# Patient Record
Sex: Female | Born: 1980 | Race: White | Hispanic: No | Smoking: Current every day smoker
Health system: Southern US, Community
[De-identification: ages and names within clinical notes are randomized; demographics above are authoritative.]

## PROBLEM LIST (undated history)

## (undated) DIAGNOSIS — F419 Anxiety disorder, unspecified: Secondary | ICD-10-CM

## (undated) DIAGNOSIS — I712 Thoracic aortic aneurysm, without rupture, unspecified: Secondary | ICD-10-CM

## (undated) DIAGNOSIS — F329 Major depressive disorder, single episode, unspecified: Secondary | ICD-10-CM

## (undated) DIAGNOSIS — F32A Depression, unspecified: Secondary | ICD-10-CM

## (undated) DIAGNOSIS — F909 Attention-deficit hyperactivity disorder, unspecified type: Secondary | ICD-10-CM

## (undated) HISTORY — DX: Depression, unspecified: F32.A

## (undated) HISTORY — PX: HIP DEBRIDEMENT: SHX1752

## (undated) HISTORY — DX: Major depressive disorder, single episode, unspecified: F32.9

---

## 2017-08-03 ENCOUNTER — Encounter: Payer: Self-pay | Admitting: Allergy and Immunology

## 2017-08-03 ENCOUNTER — Ambulatory Visit (INDEPENDENT_AMBULATORY_CARE_PROVIDER_SITE_OTHER): Payer: BLUE CROSS/BLUE SHIELD | Admitting: Allergy and Immunology

## 2017-08-03 VITALS — BP 132/100 | HR 80 | Temp 98.5°F | Resp 18 | Ht 64.0 in | Wt 160.4 lb

## 2017-08-03 DIAGNOSIS — L500 Allergic urticaria: Secondary | ICD-10-CM

## 2017-08-03 DIAGNOSIS — L989 Disorder of the skin and subcutaneous tissue, unspecified: Secondary | ICD-10-CM

## 2017-08-03 DIAGNOSIS — L308 Other specified dermatitis: Secondary | ICD-10-CM

## 2017-08-03 DIAGNOSIS — F17210 Nicotine dependence, cigarettes, uncomplicated: Secondary | ICD-10-CM | POA: Diagnosis not present

## 2017-08-03 MED ORDER — MONTELUKAST SODIUM 10 MG PO TABS: 10.0000 mg | ORAL_TABLET | Freq: Every day | ORAL | 5 refills | Status: DC

## 2017-08-03 MED ORDER — MOMETASONE FUROATE 0.1 % EX OINT: TOPICAL_OINTMENT | CUTANEOUS | 5 refills | Status: DC

## 2017-08-03 NOTE — Patient Instructions (Addendum)
  1.  Allergen avoidance measures?  2.  Every day utilize the following medications:   A.  Cetirizine 1-2 tablets 1-2 times per day (up to 40mg  daily)  B.  Montelukast 10 mg tablet 1 time per day  C.  Mometasone 0.1% ointment applied to skin lesion twice a day  3.  Blood - CBC w/diff, CMP, TSH, T4, TP,   4.  Urease breath test  5.  Continue to try nicotine substitutes to replace tobacco smoke exposure  6.  Return to clinic in 3 weeks or earlier if problem

## 2017-08-03 NOTE — Progress Notes (Signed)
Dear Dr. Jeanie Seweredding,  Thank you for referring Sheri Chapman [facility] [place] on 08/03/2017.   Below is a summation of this patient's evaluation and recommendations.  Thank you for your referral. I will keep you informed about this patient's response to treatment.   If you have any questions please do not hesitate to contact me.   Sincerely,  Jessica PriestEric J. Donabelle Molden, MD Allergy / Immunology [facility]   ______________________________________________________________________    NEW PATIENT NOTE  Referring Provider: Noni Saupeedding, John F. II, MD Primary Provider: Lise AuerKhan, Jaber A, MD Date of office visit: 08/03/2017    Subjective:   Chief Complaint:  Sheri Chapman (DOB: 02/08/1981) is a 37 y.o. female who presents to the clinic on 08/03/2017 with a chief complaint of Rash .     HPI: Sheri Chapman presents to this clinic in evaluation of a pruritic disorder.  Approximately 6 months ago Sheri Chapman started to develop red swollen areas on her skin that were approximately quarter sized and were intensely itchy with may be a very Pulido tiny fluid-filled head that she would excoriate.  She has 1 or 2 of these lesions per week.  She has multiple areas on her body that have healed with a scar or pigment because of her extensive excoriation.  She has no associated systemic or constitutional symptoms other than the fact that during these past 6 months it sounds as though she did have one episode of gastroenteritis with vomiting and diarrhea and she had some diarrhea the past 2 days.  Otherwise she does not have any associated respiratory or joint or muscle or GI symptoms with this condition in the past 6 months.  There is no obvious trigger giving rise to this issue.  She has addressed her household for bedbugs.  No one else in the household including 2 other individuals have ever had any problem with bedbugs.  She has tried to alter  her detergent.  She has treated herself with hydrocortisone cream and Benadryl which has not helped.  She has received a systemic steroid in January 2019 which has not helped.  Past Medical History:  Diagnosis Date  . Depression     Past Surgical History:  Procedure Laterality Date  . HIP DEBRIDEMENT      Allergies as of 08/03/2017   No Known Allergies     Medication List      ADDERALL XR 30 MG 24 hr capsule Generic drug:  amphetamine-dextroamphetamine Take 30 mg by mouth every morning.   ARIPiprazole 10 MG tablet Commonly known as:  ABILIFY Take 10 mg by mouth daily.   clonazePAM 1 MG tablet Commonly known as:  KLONOPIN TAKE ONE TABLET THREE TIMES A DAY, AND THEN AT BEDTIME   venlafaxine XR 75 MG 24 hr capsule Commonly known as:  EFFEXOR-XR Take 75 mg by mouth daily.       Review of systems negative except as noted in HPI / PMHx or noted below:  Review of Systems  Constitutional: Negative.   HENT: Negative.   Eyes: Negative.   Respiratory: Negative.   Cardiovascular: Negative.   Gastrointestinal: Negative.   Genitourinary: Negative.   Musculoskeletal: Negative.   Skin: Negative.   Neurological: Negative.   Endo/Heme/Allergies: Negative.   Psychiatric/Behavioral: Negative.     Family History  Problem Relation Age of Onset  . Diabetes Father   . Heart disease Maternal Grandmother   . Diabetes Maternal  Grandfather   . Diabetes Paternal Grandmother     Social History   Socioeconomic History  . Marital status: Legally Separated    Spouse name: Not on file  . Number of children: Not on file  . Years of education: Not on file  . Highest education level: Not on file  Social Needs  . Financial resource strain: Not on file  . Food insecurity - worry: Not on file  . Food insecurity - inability: Not on file  . Transportation needs - medical: Not on file  . Transportation needs - non-medical: Not on file  Occupational History  . Not on file  Tobacco  Use  . Smoking status: Current Every Day Smoker    Packs/day: 1.00    Years: 11.00    Pack years: 11.00    Types: Cigarettes  . Smokeless tobacco: Never Used  Substance and Sexual Activity  . Alcohol use: No    Frequency: Never  . Drug use: No  . Sexual activity: Not on file  Other Topics Concern  . Not on file  Social History Narrative  . Not on file    Environmental and Social history  Lives in a house with a dry environment, dogs located inside the household, Tylenol on the bedroom floor, no plastic on the bed, no plastic on the pillow, actively smoking tobacco products.  Objective:   Vitals:   08/03/17 0959  BP: (!) 132/100  Pulse: 80  Resp: 18  Temp: 98.5 F (36.9 C)   Height: 5\' 4"  (162.6 cm) Weight: 160 lb 6.4 oz (72.8 kg)  Physical Exam  Constitutional: She is well-developed, well-nourished, and in no distress.  HENT:  Head: Normocephalic. Head is without right periorbital erythema and without left periorbital erythema.  Right Ear: Tympanic membrane, external ear and ear canal normal.  Left Ear: Tympanic membrane, external ear and ear canal normal.  Nose: Nose normal. No mucosal edema or rhinorrhea.  Mouth/Throat: Uvula is midline, oropharynx is clear and moist and mucous membranes are normal. No oropharyngeal exudate.  Eyes: Conjunctivae and lids are normal. Pupils are equal, round, and reactive to light.  Neck: Trachea normal. No tracheal tenderness present. No tracheal deviation present. No thyromegaly present.  Cardiovascular: Normal rate, regular rhythm, S1 normal, S2 normal and normal heart sounds.  No murmur heard. Pulmonary/Chest: Effort normal and breath sounds normal. No stridor. No tachypnea. No respiratory distress. She has no wheezes. She has no rales. She exhibits no tenderness.  Abdominal: Soft. She exhibits no distension and no mass. There is no hepatosplenomegaly. There is no tenderness. There is no rebound and no guarding.  Musculoskeletal:  She exhibits no edema or tenderness.  Lymphadenopathy:       Head (right side): No tonsillar adenopathy present.       Head (left side): No tonsillar adenopathy present.    She has no cervical adenopathy.    She has no axillary adenopathy.  Neurological: She is alert. Gait normal.  Skin: Rash (Approximately 4 slightly erythematous nodular lesions with excoriated roof affecting arms with multiple hyperpigmented macules involving extremities.) noted. She is not diaphoretic. No erythema. No pallor. Nails show no clubbing.  Psychiatric: Mood and affect normal.    Diagnostics: Allergy skin tests were performed.  She did not demonstrate any hypersensitivity against aeroallergens or foods.  Assessment and Plan:    1. Allergic urticaria   2. Inflammatory dermatosis   3. Heavy tobacco smoker >10 cigarettes per day     1.  Allergen avoidance measures?  2.  Every day utilize the following medications:   A.  Cetirizine 1-2 tablets 1-2 times per day (up to 40mg  daily)  B.  Montelukast 10 mg tablet 1 time per day  C.  Mometasone 0.1% ointment applied to skin lesion twice a day  3.  Blood - CBC w/diff, CMP, TSH, T4, TP,   4.  Urease breath test  5.  Continue to try nicotine substitutes to replace tobacco smoke exposure  6.  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  I also had a talk with her today about finding a nicotine substitutes to replace tobacco smoke exposure.  I will see her back in this clinic in 3 weeks or earlier if there is a problem.  Jessica Priest, MD Allergy / Immunology [facility] [place]

## 2017-08-07 ENCOUNTER — Encounter: Payer: Self-pay | Admitting: Allergy and Immunology

## 2021-09-28 ENCOUNTER — Other Ambulatory Visit: Payer: Self-pay

## 2021-09-28 ENCOUNTER — Emergency Department (HOSPITAL_COMMUNITY): Payer: Medicaid Other

## 2021-09-28 ENCOUNTER — Emergency Department (HOSPITAL_COMMUNITY)
Admission: EM | Admit: 2021-09-28 | Discharge: 2021-09-29 | Disposition: A | Discharge status: Home / Self Care | Payer: Medicaid Other | Attending: Emergency Medicine | Admitting: Emergency Medicine

## 2021-09-28 ENCOUNTER — Encounter (HOSPITAL_COMMUNITY): Payer: Self-pay

## 2021-09-28 DIAGNOSIS — I959 Hypotension, unspecified: Secondary | ICD-10-CM | POA: Diagnosis not present

## 2021-09-28 DIAGNOSIS — R1084 Generalized abdominal pain: Secondary | ICD-10-CM | POA: Diagnosis not present

## 2021-09-28 DIAGNOSIS — R000 Tachycardia, unspecified: Secondary | ICD-10-CM | POA: Diagnosis not present

## 2021-09-28 DIAGNOSIS — D72829 Elevated white blood cell count, unspecified: Secondary | ICD-10-CM | POA: Diagnosis not present

## 2021-09-28 DIAGNOSIS — R7402 Elevation of levels of lactic acid dehydrogenase (LDH): Secondary | ICD-10-CM | POA: Insufficient documentation

## 2021-09-28 DIAGNOSIS — M549 Dorsalgia, unspecified: Secondary | ICD-10-CM | POA: Diagnosis not present

## 2021-09-28 DIAGNOSIS — R109 Unspecified abdominal pain: Secondary | ICD-10-CM | POA: Diagnosis present

## 2021-09-28 DIAGNOSIS — R55 Syncope and collapse: Secondary | ICD-10-CM | POA: Diagnosis not present

## 2021-09-28 LAB — COMPREHENSIVE METABOLIC PANEL
ALT: 22 U/L (ref 0–44)
AST: 23 U/L (ref 15–41)
Albumin: 3.7 g/dL (ref 3.5–5.0)
Alkaline Phosphatase: 65 U/L (ref 38–126)
Anion gap: 10 (ref 5–15)
BUN: 9 mg/dL (ref 6–20)
CO2: 21 mmol/L — ABNORMAL LOW (ref 22–32)
Calcium: 8.7 mg/dL — ABNORMAL LOW (ref 8.9–10.3)
Chloride: 105 mmol/L (ref 98–111)
Creatinine, Ser: 0.81 mg/dL (ref 0.44–1.00)
GFR, Estimated: >60 mL/min (ref >60)
Glucose, Bld: 121 mg/dL — ABNORMAL HIGH (ref 70–99)
Potassium: 3.6 mmol/L (ref 3.5–5.1)
Sodium: 136 mmol/L (ref 135–145)
Total Bilirubin: 0.5 mg/dL (ref 0.3–1.2)
Total Protein: 7.2 g/dL (ref 6.5–8.1)

## 2021-09-28 LAB — URINALYSIS, ROUTINE W REFLEX MICROSCOPIC
Glucose, UA: NEGATIVE mg/dL
Hgb urine dipstick: NEGATIVE
Ketones, ur: NEGATIVE mg/dL
Nitrite: NEGATIVE
Protein, ur: NEGATIVE mg/dL
Specific Gravity, Urine: 1.027 (ref 1.005–1.030)
pH: 5 (ref 5.0–8.0)

## 2021-09-28 LAB — CBC WITH DIFFERENTIAL/PLATELET
Abs Immature Granulocytes: 0.04 10*3/uL (ref 0.00–0.07)
Basophils Absolute: 0 10*3/uL (ref 0.0–0.1)
Basophils Relative: 0 %
Eosinophils Absolute: 0.1 10*3/uL (ref 0.0–0.5)
Eosinophils Relative: 1 %
HCT: 43.2 % (ref 36.0–46.0)
Hemoglobin: 15.1 g/dL — ABNORMAL HIGH (ref 12.0–15.0)
Immature Granulocytes: 0 %
Lymphocytes Relative: 19 %
Lymphs Abs: 2 10*3/uL (ref 0.7–4.0)
MCH: 31.2 pg (ref 26.0–34.0)
MCHC: 35 g/dL (ref 30.0–36.0)
MCV: 89.3 fL (ref 80.0–100.0)
Monocytes Absolute: 0.6 10*3/uL (ref 0.1–1.0)
Monocytes Relative: 6 %
Neutro Abs: 8 10*3/uL — ABNORMAL HIGH (ref 1.7–7.7)
Neutrophils Relative %: 74 %
Platelets: 339 10*3/uL (ref 150–400)
RBC: 4.84 MIL/uL (ref 3.87–5.11)
RDW: 12.4 % (ref 11.5–15.5)
WBC: 10.8 10*3/uL — ABNORMAL HIGH (ref 4.0–10.5)
nRBC: 0 % (ref 0.0–0.2)

## 2021-09-28 LAB — I-STAT CHEM 8, ED
BUN: 10 mg/dL (ref 6–20)
Calcium, Ion: 1.14 mmol/L — ABNORMAL LOW (ref 1.15–1.40)
Chloride: 98 mmol/L (ref 98–111)
Creatinine, Ser: 0.7 mg/dL (ref 0.44–1.00)
Glucose, Bld: 129 mg/dL — ABNORMAL HIGH (ref 70–99)
HCT: 45 % (ref 36.0–46.0)
Hemoglobin: 15.3 g/dL — ABNORMAL HIGH (ref 12.0–15.0)
Potassium: 3.9 mmol/L (ref 3.5–5.1)
Sodium: 136 mmol/L (ref 135–145)
TCO2: 27 mmol/L (ref 22–32)

## 2021-09-28 LAB — LACTIC ACID, PLASMA: Lactic Acid, Venous: 2.1 mmol/L (ref 0.5–1.9)

## 2021-09-28 LAB — LIPASE, BLOOD: Lipase: 38 U/L (ref 11–51)

## 2021-09-28 LAB — I-STAT BETA HCG BLOOD, ED (MC, WL, AP ONLY): I-stat hCG, quantitative: <5 m[IU]/mL (ref <5)

## 2021-09-28 LAB — TROPONIN I (HIGH SENSITIVITY): Troponin I (High Sensitivity): 3 ng/L (ref <18)

## 2021-09-28 MED ORDER — IOHEXOL 350 MG/ML SOLN
100.0000 mL | Freq: Once | INTRAVENOUS | Status: AC | PRN
  Administered 2021-09-28: 100 mL via INTRAVENOUS

## 2021-09-28 MED ORDER — FENTANYL CITRATE PF 50 MCG/ML IJ SOSY
50.0000 ug | PREFILLED_SYRINGE | Freq: Once | INTRAMUSCULAR | Status: AC
  Administered 2021-09-28: 50 ug via INTRAVENOUS
  Filled 2021-09-28: qty 1

## 2021-09-28 MED ORDER — SODIUM CHLORIDE 0.9 % IV BOLUS
1000.0000 mL | Freq: Once | INTRAVENOUS | Status: AC
Start: 2021-09-28 — End: 2021-09-29
  Administered 2021-09-28: 1000 mL via INTRAVENOUS

## 2021-09-28 MED ORDER — ONDANSETRON HCL 4 MG/2ML IJ SOLN
4.0000 mg | Freq: Once | INTRAMUSCULAR | Status: AC
  Administered 2021-09-28: 4 mg via INTRAVENOUS
  Filled 2021-09-28: qty 2

## 2021-09-28 NOTE — ED Triage Notes (Signed)
Pt arrives to ED POV c/o Flank Pain since Sunday. Pt states that her PCP told her that she has blood in her urine. Pt states pain when standing up or moving around. Denies difficulty urinating.  ?

## 2021-09-28 NOTE — ED Notes (Signed)
LACTIC ACID 2.1 REPORTED TO MD ?

## 2021-09-28 NOTE — ED Notes (Signed)
Pt dizzy so placed pure pick for urine culture at 21:50 ?

## 2021-09-28 NOTE — ED Notes (Signed)
Pt Hypotensive and diaphoretic in triage. ?

## 2021-09-28 NOTE — ED Provider Triage Note (Signed)
,  Emergency Medicine Provider Triage Evaluation Note ? ?Sheri Chapman , a 41 y.o. female  was evaluated in triage.  Pt complains of lightheadedness and dizziness, especially when she tries to stand.  She tried to stand 3 times today and fainted each time.  Increasing low back pain since Sunday.  No trauma.  Seen recently by PCP was noted to have hematuria with the back pain, was told to take a muscle relaxant.  Took the muscle relaxant by the name of methocarbamol for the first time today.  Endorses mild nausea.  Denies vomiting, constipation, diarrhea, fever.  Denies chest pain or shortness of breath. ? ?Review of Systems  ?Positive: As above ?Negative: As above ? ?Physical Exam  ?Ht 5\' 4"  (1.626 m)   Wt 76.7 kg   BMI 29.01 kg/m?  ?Gen:   Awake, in mild distress ?Resp:  Normal effort  ?MSK:   Moves extremities without difficulty  ?Other:  Significantly hypotensive with a manual blood pressure around 80/50. ? ?Medical Decision Making  ?Medically screening exam initiated at 8:58 PM.  Appropriate orders placed.  Sheri Chapman was informed that the remainder of the evaluation will be completed by another provider, this initial triage assessment does not replace that evaluation, and the importance of remaining in the ED until their evaluation is complete. ? ?Spoke with nursing staff about the urgency of this patient.  She has been upgraded to level 2 evaluating the first room on the back. ? ?Labs, imaging, and EKG ordered ?  ?Prince Rome, PA-C ?[id number] ? ?

## 2021-09-28 NOTE — ED Provider Notes (Signed)
?[facility] EMERGENCY DEPARTMENT ?Provider Note ? ? ?CSN: [id number] ?Arrival date & time: 09/28/21  2023 ? ?  ? ?History ? ?Chief Complaint  ?Patient presents with  ? Flank Pain  ? ? ?Sheri Chapman is a 41 y.o. female. ? ?The history is provided by the patient and medical records. No language interpreter was used.  ?Flank Pain ?This is a new problem. The current episode started more than 2 days ago. The problem occurs constantly. The problem has been rapidly worsening. Associated symptoms include abdominal pain. Pertinent negatives include no chest pain, no headaches and no shortness of breath. Nothing aggravates the symptoms. Nothing relieves the symptoms. She has tried nothing for the symptoms. The treatment provided no relief.  ? ?  ? ?Home Medications ?Prior to Admission medications   ?Medication Sig Start Date End Date Taking? Authorizing Provider  ?ADDERALL XR 30 MG 24 hr capsule Take 30 mg by mouth every morning. 07/19/17   [provider]  ?ARIPiprazole (ABILIFY) 10 MG tablet Take 10 mg by mouth daily. 07/17/17   [provider]  ?clonazePAM (KLONOPIN) 1 MG tablet TAKE ONE TABLET THREE TIMES A DAY, AND THEN AT BEDTIME 07/06/17   [provider]  ?mometasone (ELOCON) 0.1 % ointment Apply to skin lesions twice daily. 08/03/17   Kozlow, Alvira Philips, MD  ?montelukast (SINGULAIR) 10 MG tablet Take 1 tablet (10 mg total) by mouth at bedtime. 08/03/17   Kozlow, Alvira Philips, MD  ?venlafaxine XR (EFFEXOR-XR) 75 MG 24 hr capsule Take 75 mg by mouth daily. 07/17/17   [provider]  ?   ? ?Allergies    ?Patient has no known allergies.   ? ?Review of Systems   ?Review of Systems  ?Constitutional:  Positive for diaphoresis and fatigue. Negative for chills and fever.  ?HENT:  Negative for congestion.   ?Respiratory:  Negative for cough, chest tightness, shortness of breath and wheezing.   ?Cardiovascular:  Positive for palpitations. Negative for chest pain and leg swelling.   ?Gastrointestinal:  Positive for abdominal pain. Negative for constipation, diarrhea, nausea and vomiting.  ?Genitourinary:  Positive for flank pain. Negative for dysuria and frequency.  ?Musculoskeletal:  Positive for back pain. Negative for neck pain and neck stiffness.  ?Skin:  Negative for rash and wound.  ?Neurological:  Positive for light-headedness. Negative for dizziness, syncope (near syncope present), weakness and headaches.  ?Psychiatric/Behavioral:  Negative for agitation.   ?All other systems reviewed and are negative. ? ?Physical Exam ?Updated Vital Signs ?BP (!) 88/55 (BP Location: Right Arm)   Pulse 76   Temp 99.2 ?F (37.3 ?C) (Oral)   Resp 18   Ht 5\' 4"  (1.626 m)   Wt 76.7 kg   SpO2 96%   BMI 29.01 kg/m?  ?Physical Exam ?Vitals and nursing note reviewed.  ?Constitutional:   ?   General: She is not in acute distress. ?   Appearance: She is well-developed. She is ill-appearing. She is not toxic-appearing or diaphoretic.  ?HENT:  ?   Head: Normocephalic and atraumatic.  ?   Nose: Nose normal.  ?   Mouth/Throat:  ?   Mouth: Mucous membranes are dry.  ?   Pharynx: No oropharyngeal exudate or posterior oropharyngeal erythema.  ?Eyes:  ?   Extraocular Movements: Extraocular movements intact.  ?   Conjunctiva/sclera: Conjunctivae normal.  ?   Pupils: Pupils are equal, round, and reactive to light.  ?Cardiovascular:  ?   Rate and Rhythm: Regular rhythm. Tachycardia  present.  ?   Heart sounds: No murmur heard. ?Pulmonary:  ?   Effort: Pulmonary effort is normal. No respiratory distress.  ?   Breath sounds: Normal breath sounds. No wheezing, rhonchi or rales.  ?Chest:  ?   Chest wall: No tenderness.  ?Abdominal:  ?   General: Abdomen is flat.  ?   Palpations: Abdomen is soft.  ?   Tenderness: There is abdominal tenderness. There is no right CVA tenderness, left CVA tenderness, guarding or rebound.  ?Musculoskeletal:     ?   General: Tenderness present. No swelling.  ?   Cervical back: Neck supple.  ?    Right lower leg: No edema.  ?   Left lower leg: No edema.  ?Skin: ?   General: Skin is warm and dry.  ?   Capillary Refill: Capillary refill takes less than 2 seconds.  ?   Findings: No erythema or rash.  ?Neurological:  ?   General: No focal deficit present.  ?   Mental Status: She is alert.  ?   Sensory: No sensory deficit.  ?   Motor: No weakness.  ?Psychiatric:     ?   Mood and Affect: Mood normal.  ? ? ?ED Results / Procedures / Treatments   ?Labs ?(all labs ordered are listed, but only abnormal results are displayed) ?Labs Reviewed  ?CBC WITH DIFFERENTIAL/PLATELET - Abnormal; Notable for the following components:  ?    Result Value  ? WBC 10.8 (*)   ? Hemoglobin 15.1 (*)   ? Neutro Abs 8.0 (*)   ? All other components within normal limits  ?COMPREHENSIVE METABOLIC PANEL - Abnormal; Notable for the following components:  ? CO2 21 (*)   ? Glucose, Bld 121 (*)   ? Calcium 8.7 (*)   ? All other components within normal limits  ?URINALYSIS, ROUTINE W REFLEX MICROSCOPIC - Abnormal; Notable for the following components:  ? Color, Urine AMBER (*)   ? APPearance HAZY (*)   ? Bilirubin Urine MODERATE (*)   ? Leukocytes,Ua Mclear (*)   ? Bacteria, UA RARE (*)   ? All other components within normal limits  ?LACTIC ACID, PLASMA - Abnormal; Notable for the following components:  ? Lactic Acid, Venous 2.1 (*)   ? All other components within normal limits  ?I-STAT CHEM 8, ED - Abnormal; Notable for the following components:  ? Glucose, Bld 129 (*)   ? Calcium, Ion 1.14 (*)   ? Hemoglobin 15.3 (*)   ? All other components within normal limits  ?LIPASE, BLOOD  ?LACTIC ACID, PLASMA  ?POC URINE PREG, ED  ?I-STAT BETA HCG BLOOD, ED ([facility], [facility], [facility] ONLY)  ?TROPONIN I (HIGH SENSITIVITY)  ?TROPONIN I (HIGH SENSITIVITY)  ? ? ?EKG ?EKG Interpretation ? ?Date/Time:  Tuesday September 28 2021 21:03:39 EDT ?Ventricular Rate:  77 ?PR Interval:  148 ?QRS Duration: 76 ?QT Interval:  380 ?QTC Calculation: 430 ?R Axis:   76 ?Text Interpretation: Normal  sinus rhythm Normal ECG No previous ECGs available no prior ECG? for comparison. No STEMI Confirmed by Theda Belfastegeler, Chris ([id number]) on 09/28/2021 9:15:22 PM ? ?Radiology ?DG Chest Port 1 View ? ?Result Date: 09/28/2021 ?CLINICAL DATA:  Weakness and syncope. EXAM: PORTABLE CHEST 1 VIEW COMPARISON:  Chest x-ray/abdominal series 03/18/2011. FINDINGS: The heart size and mediastinal contours are within normal limits. Both lungs are clear. The visualized skeletal structures are unremarkable. IMPRESSION: No active disease. Electronically Signed   By: Mcneil SoberAmy  Guttmann M.D.  On: 09/28/2021 21:54   ? ?Procedures ?Procedures  ? ? ?Medications Ordered in ED ?Medications  ?sodium chloride 0.9 % bolus 1,000 mL (1,000 mLs Intravenous New Bag/Given 09/28/21 2154)  ?ondansetron [facility]) injection 4 mg (4 mg Intravenous Given 09/28/21 2154)  ?fentaNYL (SUBLIMAZE) injection 50 mcg (50 mcg Intravenous Given 09/28/21 2154)  ?iohexol (OMNIPAQUE) 350 MG/ML injection 100 mL (100 mLs Intravenous Contrast Given 09/28/21 2348)  ? ? ?ED Course/ Medical Decision Making/ A&P ?  ?                        ?Medical Decision Making ?Amount and/or Complexity of Data Reviewed ?Labs: ordered. ?Radiology: ordered. ? ?Risk ?Prescription drug management. ? ? ? ?JATARA HUETTNER is a 41 y.o. female with no significant past medical history who presents with severe pain in her abdomen and back and multiple nursing couple episodes with hypotension on arrival.  According to patient, for the last few days she has been having worsened pain across her low back that is also in her abdomen.  She reports that it gets up to 10 out of 10 and she was given a muscle relaxant by PCP.  Has been trying to take it but is only when worsening.  She reports the pain has moved to her front and it is sharp and stabbing and goes straight from her abdomen to the back.  She denies any constipation or diarrhea and denies any urinary symptoms at this time.  They did tell her that she had hematuria  although she is denying dysuria.  She reports the pain is also in her upper back.  She reports that 3 times today when she tried to stand up she had to lay down because she felt like she was getting ready to pass out.

## 2021-09-29 LAB — TROPONIN I (HIGH SENSITIVITY): Troponin I (High Sensitivity): 3 ng/L (ref <18)

## 2021-09-29 LAB — LACTIC ACID, PLASMA: Lactic Acid, Venous: 1.2 mmol/L (ref 0.5–1.9)

## 2021-09-29 NOTE — Discharge Instructions (Addendum)
Your blood tests, urinalysis, CT scan did not show any serious problem.  The fact that your blood pressure got better with giving you IV fluids suggest that you were dehydrated.  Please make sure that you are drinking enough fluids.  You may continue to take ibuprofen or naproxen for pain.  To get additional pain relief, you may add acetaminophen.  When you combine acetaminophen with either ibuprofen or naproxen, you get better pain relief than you get from taking either medication by itself.  If symptoms are getting worse, you are welcome to return at any time for reevaluation.  Otherwise, follow-up with your primary care provider. ?

## 2021-09-29 NOTE — ED Provider Notes (Signed)
Care assumed form Dr. Rush Landmark, patient with syncope, abdominal pain and back pain. She is pending CT angiogram dissection study results. ? 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  This is of no clinical significance.  I have independently viewed the images, and agree with the radiologist's interpretation.  Patient feels better after IV fluids.  It is noted that she had minimally elevated lactic acid level which improved after IV fluids.  Blood pressure has also improved.  Orthostatic vital signs were obtained and she has no significant change in pulse or blood pressure.  Near syncope seems to be secondary to dehydration, patient is encouraged to drink plenty of fluids.  Told to continue using over-the-counter analgesics as needed for her back pain.  Changes in her urinalysis are not felt to be clinically significant, she does not need to be treated for a UTI.  Advised to return if she develops any neurologic symptoms or if her symptoms in general are worsening. ? ?Results for orders placed or performed during the hospital encounter of 09/28/21  ?CBC with Differential  ?Result Value Ref Range  ? WBC 10.8 (H) 4.0 - 10.5 K/uL  ? RBC 4.84 3.87 - 5.11 MIL/uL  ? Hemoglobin 15.1 (H) 12.0 - 15.0 g/dL  ? HCT 43.2 36.0 - 46.0 %  ? MCV 89.3 80.0 - 100.0 fL  ? MCH 31.2 26.0 - 34.0 pg  ? MCHC 35.0 30.0 - 36.0 g/dL  ? RDW 12.4 11.5 - 15.5 %  ? Platelets 339 150 - 400 K/uL  ? nRBC 0.0 0.0 - 0.2 %  ? Neutrophils Relative % 74 %  ? Neutro Abs 8.0 (H) 1.7 - 7.7 K/uL  ? Lymphocytes Relative 19 %  ? Lymphs Abs 2.0 0.7 - 4.0 K/uL  ? Monocytes Relative 6 %  ? Monocytes Absolute 0.6 0.1 - 1.0 K/uL  ? Eosinophils Relative 1 %  ? Eosinophils Absolute 0.1 0.0 - 0.5 K/uL  ? Basophils Relative 0 %  ? Basophils Absolute 0.0 0.0 - 0.1 K/uL  ? Immature Granulocytes 0 %  ? Abs Immature Granulocytes 0.04 0.00 - 0.07 K/uL  ?Comprehensive metabolic panel  ?Result Value Ref Range  ? Sodium 136  135 - 145 mmol/L  ? Potassium 3.6 3.5 - 5.1 mmol/L  ? Chloride 105 98 - 111 mmol/L  ? CO2 21 (L) 22 - 32 mmol/L  ? Glucose, Bld 121 (H) 70 - 99 mg/dL  ? BUN 9 6 - 20 mg/dL  ? Creatinine, Ser 0.81 0.44 - 1.00 mg/dL  ? Calcium 8.7 (L) 8.9 - 10.3 mg/dL  ? Total Protein 7.2 6.5 - 8.1 g/dL  ? Albumin 3.7 3.5 - 5.0 g/dL  ? AST 23 15 - 41 U/L  ? ALT 22 0 - 44 U/L  ? Alkaline Phosphatase 65 38 - 126 U/L  ? Total Bilirubin 0.5 0.3 - 1.2 mg/dL  ? GFR, Estimated >60 >60 mL/min  ? Anion gap 10 5 - 15  ?Lipase, blood  ?Result Value Ref Range  ? Lipase 38 11 - 51 U/L  ?Urinalysis, Routine w reflex microscopic Urine, Clean Catch  ?Result Value Ref Range  ? Color, Urine AMBER (A) YELLOW  ? APPearance HAZY (A) CLEAR  ? Specific Gravity, Urine 1.027 1.005 - 1.030  ? pH 5.0 5.0 - 8.0  ? Glucose, UA NEGATIVE NEGATIVE mg/dL  ? Hgb urine dipstick NEGATIVE NEGATIVE  ? Bilirubin Urine MODERATE (A) NEGATIVE  ? Ketones, ur  NEGATIVE NEGATIVE mg/dL  ? Protein, ur NEGATIVE NEGATIVE mg/dL  ? Nitrite NEGATIVE NEGATIVE  ? Leukocytes,Ua [place] (A) NEGATIVE  ? RBC / HPF 0-5 0 - 5 RBC/hpf  ? WBC, UA 6-10 0 - 5 WBC/hpf  ? Bacteria, UA RARE (A) NONE SEEN  ? Squamous Epithelial / LPF 6-10 0 - 5  ?Lactic acid, plasma  ?Result Value Ref Range  ? Lactic Acid, Venous 2.1 (HH) 0.5 - 1.9 mmol/L  ?Lactic acid, plasma  ?Result Value Ref Range  ? Lactic Acid, Venous 1.2 0.5 - 1.9 mmol/L  ?I-Stat beta hCG blood, ED  ?Result Value Ref Range  ? I-stat hCG, quantitative <5.0 <5 mIU/mL  ? Comment 3          ?I-stat chem 8, ED (not at [facility] or [facility])  ?Result Value Ref Range  ? Sodium 136 135 - 145 mmol/L  ? Potassium 3.9 3.5 - 5.1 mmol/L  ? Chloride 98 98 - 111 mmol/L  ? BUN 10 6 - 20 mg/dL  ? Creatinine, Ser 0.70 0.44 - 1.00 mg/dL  ? Glucose, Bld 129 (H) 70 - 99 mg/dL  ? Calcium, Ion 1.14 (L) 1.15 - 1.40 mmol/L  ? TCO2 27 22 - 32 mmol/L  ? Hemoglobin 15.3 (H) 12.0 - 15.0 g/dL  ? HCT 45.0 36.0 - 46.0 %  ?Troponin I (High Sensitivity)  ?Result Value Ref Range  ? Troponin  I (High Sensitivity) 3 <18 ng/L  ?Troponin I (High Sensitivity)  ?Result Value Ref Range  ? Troponin I (High Sensitivity) 3 <18 ng/L  ? ?DG Chest Port 1 View ? ?Result Date: 09/28/2021 ?CLINICAL DATA:  Weakness and syncope. EXAM: PORTABLE CHEST 1 VIEW COMPARISON:  Chest x-ray/abdominal series 03/18/2011. FINDINGS: The heart size and mediastinal contours are within normal limits. Both lungs are clear. The visualized skeletal structures are unremarkable. IMPRESSION: No active disease. Electronically Signed   By: Darliss Cheney M.D.   On: 09/28/2021 21:54  ? ?CT Angio Chest/Abd/Pel for Dissection W and/or Wo Contrast ? ?Result Date: 09/29/2021 ?CLINICAL DATA:  Chest and back pain.  Concern for aortic dissection. EXAM: CT ANGIOGRAPHY CHEST, ABDOMEN AND PELVIS TECHNIQUE: Non-contrast CT of the chest was initially obtained. Multidetector CT imaging through the chest, abdomen and pelvis was performed using the standard protocol during bolus administration of intravenous contrast. Multiplanar reconstructed images and MIPs were obtained and reviewed to evaluate the vascular anatomy. RADIATION DOSE REDUCTION: This exam was performed according to the departmental dose-optimization program which includes automated exposure control, adjustment of the mA and/or kV according to patient size and/or use of iterative reconstruction technique. CONTRAST:  OMNIPAQUE IOHEXOL 350 MG/ML SOLN COMPARISON:  CT abdomen pelvis dated 07/07/2016. FINDINGS: CTA CHEST FINDINGS Cardiovascular: There is no cardiomegaly or pericardial effusion. The thoracic aorta is unremarkable. 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 The origins of the great vessels of the aortic arch appear patent. The central pulmonary arteries appear unremarkable for the degree of opacification. Mediastinum/Nodes: No hilar or mediastinal adenopathy. The esophagus is grossly unremarkable. No mediastinal fluid collection. Lungs/Pleura:  The lungs are clear. There is no pleural effusion or pneumothorax. The central airways are patent. Musculoskeletal: No chest wall abnormality. No acute or significant osseous findings. Review of the MIP images confirms the above findings. CTA ABDOMEN AND PELVIS FINDINGS VASCULAR Aorta: Normal caliber aorta without aneurysm, dissection, vasculitis or significant stenosis. Celiac: Patent without evidence of aneurysm, dissection, vasculitis or significant stenosis. SMA: Patent without evidence of  aneurysm, dissection, vasculitis or significant stenosis. Renals: Both renal arteries are patent without evidence of aneurysm, dissection, vasculitis, fibromuscular dysplasia or significant stenosis. IMA: Patent without evidence of aneurysm, dissection, vasculitis or significant stenosis. Inflow: Patent without evidence of aneurysm, dissection, vasculitis or significant stenosis. Veins: No obvious venous abnormality within the limitations of this arterial phase study. Review of the MIP images confirms the above findings. NON-VASCULAR No intra-abdominal free air or free fluid. Hepatobiliary: No focal liver abnormality is seen. No gallstones, gallbladder wall thickening, or biliary dilatation. Pancreas: Unremarkable. No pancreatic ductal dilatation or surrounding inflammatory changes. Spleen: Normal in size without focal abnormality. Adrenals/Urinary Tract: Adrenal glands are unremarkable. Kidneys are normal, without renal calculi, focal lesion, or hydronephrosis. Bladder is unremarkable. Stomach/Bowel: There is no bowel obstruction or active inflammation. The appendix is normal. Lymphatic: No adenopathy. Reproductive: The uterus is anteverted. An intrauterine device is noted. No adnexal masses. Other: None Musculoskeletal: No acute or significant osseous findings. Review of the MIP images confirms the above findings. IMPRESSION: 1. No acute intrathoracic, abdominal, or pelvic pathology. No aortic dissection. 2. Left-sided  aortic arch with aberrant right subclavian artery anatomy. Electronically Signed   By: Elgie CollardArash  Radparvar M.D.   On: 09/29/2021 00:11   ? ? ?  ?Dione BoozeGlick, Sheba Whaling, MD ?09/29/21 0327 ? ?

## 2022-07-26 ENCOUNTER — Other Ambulatory Visit: Payer: Self-pay

## 2022-07-26 ENCOUNTER — Emergency Department (HOSPITAL_COMMUNITY): Payer: Medicaid Other

## 2022-07-26 ENCOUNTER — Encounter (HOSPITAL_COMMUNITY): Payer: Self-pay

## 2022-07-26 ENCOUNTER — Emergency Department (HOSPITAL_COMMUNITY)
Admission: EM | Admit: 2022-07-26 | Discharge: 2022-07-26 | Disposition: A | Discharge status: Home / Self Care | Payer: Medicaid Other | Attending: Emergency Medicine | Admitting: Emergency Medicine

## 2022-07-26 DIAGNOSIS — R079 Chest pain, unspecified: Secondary | ICD-10-CM | POA: Diagnosis present

## 2022-07-26 HISTORY — DX: Attention-deficit hyperactivity disorder, unspecified type: F90.9

## 2022-07-26 HISTORY — DX: Anxiety disorder, unspecified: F41.9

## 2022-07-26 HISTORY — DX: Thoracic aortic aneurysm, without rupture, unspecified: I71.20

## 2022-07-26 LAB — COMPREHENSIVE METABOLIC PANEL
ALT: 23 U/L (ref 0–44)
AST: 28 U/L (ref 15–41)
Albumin: 4 g/dL (ref 3.5–5.0)
Alkaline Phosphatase: 81 U/L (ref 38–126)
Anion gap: 12 (ref 5–15)
BUN: 6 mg/dL (ref 6–20)
CO2: 24 mmol/L (ref 22–32)
Calcium: 8.8 mg/dL — ABNORMAL LOW (ref 8.9–10.3)
Chloride: 99 mmol/L (ref 98–111)
Creatinine, Ser: 0.77 mg/dL (ref 0.44–1.00)
GFR, Estimated: >60 mL/min (ref >60)
Glucose, Bld: 88 mg/dL (ref 70–99)
Potassium: 3.4 mmol/L — ABNORMAL LOW (ref 3.5–5.1)
Sodium: 135 mmol/L (ref 135–145)
Total Bilirubin: 0.1 mg/dL — ABNORMAL LOW (ref 0.3–1.2)
Total Protein: 7.4 g/dL (ref 6.5–8.1)

## 2022-07-26 LAB — I-STAT CHEM 8, ED
BUN: 7 mg/dL (ref 6–20)
Calcium, Ion: 1.17 mmol/L (ref 1.15–1.40)
Chloride: 99 mmol/L (ref 98–111)
Creatinine, Ser: 0.7 mg/dL (ref 0.44–1.00)
Glucose, Bld: 86 mg/dL (ref 70–99)
HCT: 46 % (ref 36.0–46.0)
Hemoglobin: 15.6 g/dL — ABNORMAL HIGH (ref 12.0–15.0)
Potassium: 3.4 mmol/L — ABNORMAL LOW (ref 3.5–5.1)
Sodium: 137 mmol/L (ref 135–145)
TCO2: 27 mmol/L (ref 22–32)

## 2022-07-26 LAB — CBC
HCT: 43.7 % (ref 36.0–46.0)
Hemoglobin: 14.5 g/dL (ref 12.0–15.0)
MCH: 30.1 pg (ref 26.0–34.0)
MCHC: 33.2 g/dL (ref 30.0–36.0)
MCV: 90.9 fL (ref 80.0–100.0)
Platelets: 359 10*3/uL (ref 150–400)
RBC: 4.81 MIL/uL (ref 3.87–5.11)
RDW: 12.1 % (ref 11.5–15.5)
WBC: 6.8 10*3/uL (ref 4.0–10.5)
nRBC: 0 % (ref 0.0–0.2)

## 2022-07-26 LAB — TROPONIN I (HIGH SENSITIVITY)
Troponin I (High Sensitivity): 3 ng/L (ref <18)
Troponin I (High Sensitivity): 3 ng/L (ref <18)

## 2022-07-26 LAB — LIPASE, BLOOD: Lipase: 35 U/L (ref 11–51)

## 2022-07-26 LAB — I-STAT BETA HCG BLOOD, ED (MC, WL, AP ONLY): I-stat hCG, quantitative: <5 m[IU]/mL (ref <5)

## 2022-07-26 MED ORDER — IOHEXOL 350 MG/ML SOLN
75.0000 mL | Freq: Once | INTRAVENOUS | Status: AC | PRN
  Administered 2022-07-26: 75 mL via INTRAVENOUS

## 2022-07-26 NOTE — Discharge Instructions (Addendum)
Return if any problems.  You have had a referral made to cardiology.  They should contact you with appointment.  Please call cardiology if they have not called you in the next 2 days.

## 2022-07-26 NOTE — ED Provider Triage Note (Signed)
Emergency Medicine Provider Triage Evaluation Note  Sheri Chapman , a 42 y.o. female  was evaluated in triage.  Pt complains of cp.  Patient reports she woke from sleep in the middle of the night with pain in her chest.  She reports that she was recently diagnosed with pleurisy but that was getting better.  She reports a history of a thoracic aortic aneurysm however review of EMR shows CTA dissection study with impression listed below that reports no evidence of aneurysm or dissection in the past.  Patient states that she took a hit of her vape began coughing and then felt a pop in her chest on the left side below the left breast.  She complains of sharp severe pain radiating to her back.  She states she had 1 episode of vomiting.    IMPRESSION: CTA dissection study (09/29/2021) 1. No acute intrathoracic, abdominal, or pelvic pathology. No aortic dissection. 2. Left-sided aortic arch with aberrant right subclavian artery anatomy.   Review of Systems  Positive: Cp  Negative: so  Physical Exam  BP (!) 161/118 (BP Location: Right Arm)   Pulse 78   Temp 99.2 F (37.3 C)   Resp 16   Ht 5' 4"$  (1.626 m)   Wt 76.2 kg   SpO2 99%   BMI 28.84 kg/m  Gen:   Awake, no distress   Resp:  Normal effort  MSK:   Moves extremities without difficulty  Other:  TTP Left lower chest wall  Medical Decision Making  Medically screening exam initiated at 9:30 AM.  Appropriate orders placed.  Sheri Chapman was informed that the remainder of the evaluation will be completed by another provider, this initial triage assessment does not replace that evaluation, and the importance of remaining in the ED until their evaluation is complete.     Margarita Mail, PA-C 07/26/22 [phone]

## 2022-07-26 NOTE — ED Triage Notes (Signed)
Pt arrived POV from home c/o left sided chest pain that started last night and then she woke up around 6am coughing and heard a pop. Pt states the pain radiates into her back. Pt endorses some SHOB and nausea.

## 2022-07-26 NOTE — ED Provider Notes (Signed)
[facility] Provider Note   CSN: [id number] Arrival date & time: 07/26/22  [id number]     History  Chief Complaint  Patient presents with   Chest Pain    Sheri Chapman is a 42 y.o. female.  Pt reports she began having chest pain around 6am.  Pt reports the pain began suddenly Pt reports she felt a pop in her chest and has had pain since.  Pt reports still having chest pain.  Pt reports she had a ct scan in February that showed a 4.1 cm ascending aneurysm.  The history is provided by the patient. No language interpreter was used.  Chest Pain Pain location:  Substernal area Pain quality: aching   Progression:  Unchanged Chronicity:  New Relieved by:  Nothing Worsened by:  Nothing Ineffective treatments:  None tried Associated symptoms: no cough, no fever and no nausea        Home Medications Prior to Admission medications   Medication Sig Start Date End Date Taking? Authorizing Provider  amphetamine-dextroamphetamine (ADDERALL) 30 MG tablet Take 1 tablet by mouth 2 (two) times daily. 09/12/21   [provider]  ARIPiprazole (ABILIFY) 10 MG tablet Take 10 mg by mouth daily. 07/17/17   [provider]  methocarbamol (ROBAXIN) 500 MG tablet Take 1,000 mg by mouth in the morning and at bedtime.    [provider]  mometasone (ELOCON) 0.1 % ointment Apply to skin lesions twice daily. Patient not taking: Reported on 09/28/2021 08/03/17   Jiles Prows, MD  montelukast (SINGULAIR) 10 MG tablet Take 1 tablet (10 mg total) by mouth at bedtime. Patient not taking: Reported on 09/28/2021 08/03/17   Jiles Prows, MD  venlafaxine XR (EFFEXOR-XR) 75 MG 24 hr capsule Take 75 mg by mouth daily. 07/17/17   [provider]      Allergies    Patient has no known allergies.    Review of Systems   Review of Systems  Constitutional:  Negative for fever.  Respiratory:  Negative for cough.   Cardiovascular:  Positive for  chest pain.  Gastrointestinal:  Negative for nausea.  All other systems reviewed and are negative.   Physical Exam Updated Vital Signs BP (!) 161/118 (BP Location: Right Arm)   Pulse 78   Temp 99.2 F (37.3 C)   Resp 16   Ht 5' 4"$  (1.626 m)   Wt 76.2 kg   SpO2 99%   BMI 28.84 kg/m  Physical Exam Vitals and nursing note reviewed.  Constitutional:      Appearance: She is well-developed.  HENT:     Head: Normocephalic.  Cardiovascular:     Rate and Rhythm: Normal rate and regular rhythm.     Heart sounds: Normal heart sounds.  Pulmonary:     Effort: Pulmonary effort is normal.  Abdominal:     General: Bowel sounds are normal. There is no distension.     Palpations: Abdomen is soft.  Musculoskeletal:        General: Normal range of motion.     Cervical back: Normal range of motion.  Skin:    General: Skin is warm.  Neurological:     Mental Status: She is alert and oriented to person, place, and time.     ED Results / Procedures / Treatments   Labs (all labs ordered are listed, but only abnormal results are displayed) Labs Reviewed  COMPREHENSIVE METABOLIC PANEL - Abnormal; Notable for the following components:  Result Value   Potassium 3.4 (*)    Calcium 8.8 (*)    Total Bilirubin 0.1 (*)    All other components within normal limits  I-STAT CHEM 8, ED - Abnormal; Notable for the following components:   Potassium 3.4 (*)    Hemoglobin 15.6 (*)    All other components within normal limits  CBC  LIPASE, BLOOD  I-STAT BETA HCG BLOOD, ED ([facility], [facility], [facility] ONLY)  TROPONIN I (HIGH SENSITIVITY)  TROPONIN I (HIGH SENSITIVITY)    EKG EKG Interpretation  Date/Time:  Tuesday July 26 2022 09:16:10 EST Ventricular Rate:  84 PR Interval:  146 QRS Duration: 76 QT Interval:  350 QTC Calculation: 413 R Axis:   57 Text Interpretation: Normal sinus rhythm with sinus arrhythmia Normal ECG When compared with ECG of 28-Sep-2021 21:03, PREVIOUS ECG IS PRESENT no stemi  Confirmed by Wynona Dove ([id number]) on 07/26/2022 10:07:27 AM  Radiology CT Angio Chest PE W and/or Wo Contrast  Result Date: 07/26/2022 CLINICAL DATA:  Pulmonary embolism suspected. Positive D-dimer. History of thoracic aortic aneurysm. EXAM: CT ANGIOGRAPHY CHEST WITH CONTRAST TECHNIQUE: Multidetector CT imaging of the chest was performed using the standard protocol during bolus administration of intravenous contrast. Multiplanar CT image reconstructions and MIPs were obtained to evaluate the vascular anatomy. RADIATION DOSE REDUCTION: This exam was performed according to the departmental dose-optimization program which includes automated exposure control, adjustment of the mA and/or kV according to patient size and/or use of iterative reconstruction technique. CONTRAST:  22m OMNIPAQUE IOHEXOL 350 MG/ML SOLN COMPARISON:  CTA chest 07/14/2022. CTA chest/abdomen/pelvis 09/28/2021. FINDINGS: Cardiovascular: Satisfactory opacification of the pulmonary arteries to the segmental level. No evidence of pulmonary embolism. Unchanged 4.1 cm ascending aortic aneurysm (image 55 series 8). Aberrant right subclavian artery. Mediastinum/Nodes: No enlarged mediastinal, hilar, or axillary lymph nodes. Thyroid gland, trachea, and esophagus demonstrate no significant findings. Lungs/Pleura: Lungs are clear. No pleural effusion or pneumothorax. Upper Abdomen: No acute abnormality. Musculoskeletal: Mild sclerosis of the lateral left fifth and right seventh ribs, favored to represent healed rib fractures. No acute bony abnormality. Review of the MIP images confirms the above findings. IMPRESSION: 1. No evidence of pulmonary embolism or other acute findings in the chest. 2. Unchanged 4.1 cm ascending aortic aneurysm. Electronically Signed   By: WEmmit AlexandersM.D.   On: 07/26/2022 11:58   DG Chest 2 View  Result Date: 07/26/2022 CLINICAL DATA:  Chest pain EXAM: CHEST - 2 VIEW COMPARISON:  Chest x-ray dated July 14, 2022  FINDINGS: The heart size and mediastinal contours are within normal limits. Both lungs are clear. The visualized skeletal structures are unremarkable. IMPRESSION: No active cardiopulmonary disease. Electronically Signed   By: LYetta GlassmanM.D.   On: 07/26/2022 10:22    Procedures Procedures    Medications Ordered in ED Medications  iohexol (OMNIPAQUE) 350 MG/ML injection 75 mL (75 mLs Intravenous Contrast Given 07/26/22 1149)    ED Course/ Medical Decision Making/ A&P                             Medical Decision Making Patient complains of chest pain since 6 AM this morning.  Patient reports she recently was diagnosed with a 4.1 cm thoracic aortic aneurysm.  Patient is scheduled to see Dr. THarriet Massonwith cardiology for further evaluation  Amount and/or Complexity of Data Reviewed External Data Reviewed: radiology.    Details: [facility]  This shows a 4.1 cm thoracic aortic aneurysm Labs: ordered. Decision-making details documented in ED Course.    Details: Abs ordered reviewed and interpreted patient has troponin negative x 2 Radiology: ordered.    Details: Chest x-ray no acute abnormality CT angio chest shows no evidence of pulmonary embolus there is no change in aneurysm Discussion of management or test interpretation with external provider(s): Patient spoke to her cardiologist office before coming to the emergency department they have requested that we place a ED referral.  Risk Risk Details: And is counseled on her results she is advised to follow-up with cardiology as previously advised.  I will put in a referral.           Final Clinical Impression(s) / ED Diagnoses Final diagnoses:  Nonspecific chest pain    Rx / DC Orders ED Discharge Orders     None     An After Visit Summary was printed and given to the patient.     Sidney Ace 07/26/22 [place], MD 07/26/22 2106

## 2022-08-09 ENCOUNTER — Encounter: Payer: Self-pay | Admitting: Cardiology

## 2022-08-09 ENCOUNTER — Ambulatory Visit: Payer: Medicaid Other | Attending: Cardiology | Admitting: Cardiology

## 2022-08-09 VITALS — BP 148/102 | HR 93 | Ht 64.0 in | Wt 169.2 lb

## 2022-08-09 DIAGNOSIS — R072 Precordial pain: Secondary | ICD-10-CM

## 2022-08-09 DIAGNOSIS — R002 Palpitations: Secondary | ICD-10-CM | POA: Diagnosis present

## 2022-08-09 DIAGNOSIS — I10 Essential (primary) hypertension: Secondary | ICD-10-CM | POA: Diagnosis not present

## 2022-08-09 DIAGNOSIS — I77819 Aortic ectasia, unspecified site: Secondary | ICD-10-CM | POA: Diagnosis not present

## 2022-08-09 DIAGNOSIS — Z01812 Encounter for preprocedural laboratory examination: Secondary | ICD-10-CM | POA: Diagnosis not present

## 2022-08-09 MED ORDER — CARVEDILOL 6.25 MG PO TABS: 6.2500 mg | ORAL_TABLET | Freq: Two times a day (BID) | ORAL | 3 refills | Status: DC

## 2022-08-09 MED ORDER — METOPROLOL TARTRATE 100 MG PO TABS: ORAL_TABLET | ORAL | 0 refills | Status: DC

## 2022-08-09 NOTE — Patient Instructions (Addendum)
Medication Instructions:  Your physician has recommended you make the following change in your medication:  START: Coreg 6.25 mg twice daily  Please take your blood pressure daily for 2 weeks and send in a MyChart message. Please include heart rates.   HOW TO TAKE YOUR BLOOD PRESSURE: Rest 5 minutes before taking your blood pressure. Don't smoke or drink caffeinated beverages for at least 30 minutes before. Take your blood pressure before (not after) you eat. Sit comfortably with your back supported and both feet on the floor (don't cross your legs). Elevate your arm to heart level on a table or a desk. Use the proper sized cuff. It should fit smoothly and snugly around your bare upper arm. There should be enough room to slip a fingertip under the cuff. The bottom edge of the cuff should be 1 inch above the crease of the elbow. Ideally, take 3 measurements at one sitting and record the average.  *If you need a refill on your cardiac medications before your next appointment, please call your pharmacy*   Lab Work: Your physician recommends that you have labs drawn: BMET, [place] If you have labs (blood work) drawn today and your tests are completely normal, you will receive your results only by: MyChart Message (if you have MyChart) OR A paper copy in the mail If you have any lab test that is abnormal or we need to change your treatment, we will call you to review the results.   Testing/Procedures:   Your cardiac CT will be scheduled at one of the below locations:   [facility] [address] [place], [place] [address] [phone]  If scheduled at [facility], please arrive at the [facility] and Children's Entrance (Entrance C2) of [facility] 30 minutes prior to test start time. You can use the FREE valet parking offered at entrance C (encouraged to control the heart rate for the test)  Proceed to the [facility] Radiology Department (first floor) to  check-in and test prep.  All radiology patients and guests should use entrance C2 at [facility], accessed from [facility], even though the hospital's physical address listed is [address]    Please follow these instructions carefully (unless otherwise directed):  On the Night Before the Test: Be sure to Drink plenty of water. Do not consume any caffeinated/decaffeinated beverages or chocolate 12 hours prior to your test. Do not take any antihistamines 12 hours prior to your test.  On the Day of the Test: Drink plenty of water until 1 hour prior to the test. Do not eat any food 1 hour prior to test. You may take your regular medications prior to the test.  Take metoprolol (Lopressor) two hours prior to test. Hold Carvedilol (Coreg) day of test.  FEMALES- please wear underwire-free bra if available, avoid dresses & tight clothing       After the Test: Drink plenty of water. After receiving IV contrast, you may experience a mild flushed feeling. This is normal. On occasion, you may experience a mild rash up to 24 hours after the test. This is not dangerous. If this occurs, you can take Benadryl 25 mg and increase your fluid intake. If you experience trouble breathing, this can be serious. If it is severe call 911 IMMEDIATELY. If it is mild, please call our office. If you take any of these medications: Glipizide/Metformin, Avandament, Glucavance, please do not take 48 hours after completing test unless otherwise instructed.  We will call to schedule your test 2-4 weeks out understanding that some insurance companies will need an authorization prior to the service being performed.   For non-scheduling related questions, please contact the cardiac imaging nurse navigator should you have any questions/concerns: Marchia Bond, Cardiac Imaging Nurse Navigator Gordy Clement, Cardiac Imaging Nurse Navigator [facility]  Office Dial: [phone]   For scheduling needs, including cancellations and rescheduling, please call [place], [phone].    Follow-Up: At [facility], you and your health needs are our priority.  As part of our continuing mission to provide you with exceptional heart care, we have created designated Provider Care Teams.  These Care Teams include your primary Cardiologist (physician) and Advanced Practice Providers (APPs -  Physician Assistants and Nurse Practitioners) who all work together to provide you with the care you need, when you need it.  We recommend signing up for the patient portal called "MyChart".  Sign up information is provided on this After Visit Summary.  MyChart is used to connect with patients for Virtual Visits (Telemedicine).  Patients are able to view lab/test results, encounter notes, upcoming appointments, etc.  Non-urgent messages can be sent to your provider as well.   To learn more about what you can do with MyChart, go to [email or web address].    Your next appointment:   20 week(s)  Provider:   Berniece Salines, DO

## 2022-08-09 NOTE — Progress Notes (Unsigned)
Cardiology Office Note:    Date:  08/10/2022   ID:  TRENELL RAYL, DOB June 07, 1981, MRN [id number]  PCP:  Mateo Flow, MD  Cardiologist:  Berniece Salines, DO  Electrophysiologist:  None   Referring MD: Fransico Meadow, PA-C   " I am having chest pain"  History of Present Illness:    Sheri Chapman is a 42 y.o. 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  She describes it as a squeezing sensation comes off and on.  She is very worried about this.  She tells me her sister died at age 29 and this is what makes her stressed because they were told that her sister died from heart attack.  No other complaints at this time.   Past Medical History:  Diagnosis Date   ADHD    Anxiety    Depression    Thoracic aortic aneurysm [facility])     Past Surgical History:  Procedure Laterality Date   HIP DEBRIDEMENT      Current Medications: Current Meds  Medication Sig   amoxicillin (AMOXIL) 500 MG capsule Take 500 mg by mouth 2 (two) times daily.   amphetamine-dextroamphetamine (ADDERALL) 30 MG tablet Take 1 tablet by mouth 2 (two) times daily.   ARIPiprazole (ABILIFY) 10 MG tablet Take 10 mg by mouth daily.   carvedilol (COREG) 6.25 MG tablet Take 1 tablet (6.25 mg total) by mouth 2 (two) times daily.   metoprolol tartrate (LOPRESSOR) 100 MG tablet Take 2 hours prior to CT   venlafaxine XR (EFFEXOR-XR) 75 MG 24 hr capsule Take 75 mg by mouth daily.     Allergies:   Patient has no known allergies.   Social History   Socioeconomic History   Marital status: Legally Separated    Spouse name: Not on file   Number of children: Not on file   Years of education: Not on file   Highest education level: Not on file  Occupational History   Not on file  Tobacco Use   Smoking status: Every Day    Packs/day: 1.00    Years: 11.00    Total pack years: 11.00    Types: Cigarettes   Smokeless tobacco: Never  Substance  and Sexual Activity   Alcohol use: No   Drug use: No   Sexual activity: Not on file  Other Topics Concern   Not on file  Social History Narrative   Not on file   Social Determinants of Health   Financial Resource Strain: Not on file  Food Insecurity: Not on file  Transportation Needs: Not on file  Physical Activity: Not on file  Stress: Not on file  Social Connections: Not on file     Family History: The patient's family history includes Diabetes in her father, maternal grandfather, and paternal grandmother; Heart disease in her maternal grandmother.  ROS:   Review of Systems  Constitution: Negative for decreased appetite, fever and weight gain.  HENT: Negative for congestion, ear discharge, hoarse voice and sore throat.   Eyes: Negative for discharge, redness, vision loss in right eye and visual halos.  Cardiovascular: Negative for chest pain, dyspnea on exertion, leg swelling, orthopnea and palpitations.  Respiratory: Negative for cough, hemoptysis, shortness of breath and snoring.   Endocrine: Negative for heat intolerance and polyphagia.  Hematologic/Lymphatic: Negative for bleeding problem. Does not bruise/bleed easily.  Skin: Negative for flushing, nail changes, rash and suspicious lesions.  Musculoskeletal: Negative for arthritis, joint pain, muscle cramps, myalgias, neck pain and stiffness.  Gastrointestinal: Negative for abdominal pain, bowel incontinence, diarrhea and excessive appetite.  Genitourinary: Negative for decreased libido, genital sores and incomplete emptying.  Neurological: Negative for brief paralysis, focal weakness, headaches and loss of balance.  Psychiatric/Behavioral: Negative for altered mental status, depression and suicidal ideas.  Allergic/Immunologic: Negative for HIV exposure and persistent infections.    EKGs/Labs/Other Studies Reviewed:    The following studies were reviewed today:   EKG:  The ekg ordered today demonstrates   Recent  Labs: 07/26/2022: ALT 23; Hemoglobin 15.6; Platelets 359 08/09/2022: BUN 7; Creatinine, Ser 0.73; Magnesium 2.0; Potassium 4.8; Sodium 135  Recent Lipid Panel No results found for: "CHOL", "TRIG", "HDL", "CHOLHDL", "VLDL", "LDLCALC", "LDLDIRECT"  Physical Exam:    VS:  BP (!) 148/102 (BP Location: Left Arm, Patient Position: Sitting, Cuff Size: Normal)   Pulse 93   Ht '5\' 4"'$  (1.626 m)   Wt 76.7 kg   SpO2 98%   BMI 29.04 kg/m     Wt Readings from Last 3 Encounters:  08/09/22 76.7 kg  07/26/22 76.2 kg  09/28/21 76.7 kg     GEN: Well nourished, well developed in no acute distress HEENT: Normal NECK: No JVD; No carotid bruits LYMPHATICS: No lymphadenopathy CARDIAC: S1S2 noted,RRR, no murmurs, rubs, gallops RESPIRATORY:  Clear to auscultation without rales, wheezing or rhonchi  ABDOMEN: Soft, non-tender, non-distended, +bowel sounds, no guarding. EXTREMITIES: No edema, No cyanosis, no clubbing MUSCULOSKELETAL:  No deformity  SKIN: Warm and dry NEUROLOGIC:  Alert and oriented x 3, non-focal PSYCHIATRIC:  Normal affect, good insight  ASSESSMENT:    1. Precordial pain   2. Pre-procedure lab exam   3. Aortic dilatation (HCC)   4. Primary hypertension   5. Palpitations    PLAN:    She is hypertensive in the office today. It is best to start antihypertensive medications. With the associated palpitations and thoracic aneurysm it will benefit from a beta blocker. Will start coreg 6.25 mg BID.   The symptoms chest pain is concerning, this patient does have intermediate risk for coronary artery disease and at this time I would like to pursue an ischemic evaluation in this patient.  Shared decision a coronary CTA at this time is appropriate.  I have discussed with the patient about the testing.  The patient has no IV contrast allergy and is agreeable to proceed with this test.     The patient is in agreement with the above plan. The patient left the office in stable condition.  The  patient will follow up in   Medication Adjustments/Labs and Tests Ordered: Current medicines are reviewed at length with the patient today.  Concerns regarding medicines are outlined above.  Orders Placed This Encounter  Procedures   CT CORONARY MORPH W/CTA COR W/SCORE W/CA W/CM &/OR WO/CM   Basic Metabolic Panel (BMET)   Magnesium   Meds ordered this encounter  Medications   metoprolol tartrate (LOPRESSOR) 100 MG tablet    Sig: Take 2 hours prior to CT    Dispense:  1 tablet    Refill:  0   carvedilol (COREG) 6.25 MG tablet    Sig: Take 1 tablet (6.25 mg total) by mouth 2 (two) times daily.    Dispense:  180 tablet    Refill:  3    Patient Instructions  Medication Instructions:  Your physician has recommended you make the following change in your medication:  START: Coreg  6.25 mg twice daily  Please take your blood pressure daily for 2 weeks and send in a MyChart message. Please include heart rates.   HOW TO TAKE YOUR BLOOD PRESSURE: Rest 5 minutes before taking your blood pressure. Don't smoke or drink caffeinated beverages for at least 30 minutes before. Take your blood pressure before (not after) you eat. Sit comfortably with your back supported and both feet on the floor (don't cross your legs). Elevate your arm to heart level on a table or a desk. Use the proper sized cuff. It should fit smoothly and snugly around your bare upper arm. There should be enough room to slip a fingertip under the cuff. The bottom edge of the cuff should be 1 inch above the crease of the elbow. Ideally, take 3 measurements at one sitting and record the average.  *If you need a refill on your cardiac medications before your next appointment, please call your pharmacy*   Lab Work: Your physician recommends that you have labs drawn: BMET, [place] If you have labs (blood work) drawn today and your tests are completely normal, you will receive your results only by: MyChart Message (if you have  MyChart) OR A paper copy in the mail If you have any lab test that is abnormal or we need to change your treatment, we will call you to review the results.   Testing/Procedures:   Your cardiac CT will be scheduled at one of the below locations:   [facility] [address] [place], [place] [address] [phone]  If scheduled at [facility], please arrive at the [facility] En[facility]ance (Entrance C2) of [facility] 30 minutes prior to test start time. You can use the FREE valet parking offered at entrance C (encouraged to control the heart rate for the test)  Proceed to the [facility] Radiology Department (first floor) to check-in and test prep.  All radiology patients and guests should use entrance C2 at [facility], accessed from [facility], even though the hospital's physical address listed is [address]    Please follow these instructions carefully (unless otherwise directed):  On the Night Before the Test: Be sure to Drink plenty of water. Do not consume any caffeinated/decaffeinated beverages or chocolate 12 hours prior to your test. Do not take any antihistamines 12 hours prior to your test.  On the Day of the Test: Drink plenty of water until 1 hour prior to the test. Do not eat any food 1 hour prior to test. You may take your regular medications prior to the test.  Take metoprolol (Lopressor) two hours prior to test. Hold Carvedilol (Coreg) day of test.  FEMALES- please wear underwire-free bra if available, avoid dresses & tight clothing       After the Test: Drink plenty of water. After receiving IV contrast, you may experience a mild flushed feeling. This is normal. On occasion, you may experience a mild rash up to 24 hours after the test. This is not dangerous. If this occurs, you can take Benadryl 25 mg and increase your fluid intake. If you experience trouble breathing, this can be serious.  If it is severe call 911 IMMEDIATELY. If it is mild, please call our office. If you take any of these medications: Glipizide/Metformin, Avandament, Glucavance, please do not take 48 hours after completing test unless otherwise instructed.  We will call to schedule your test 2-4 weeks out understanding that some insurance companies will  need an authorization prior to the service being performed.   For non-scheduling related questions, please contact the cardiac imaging nurse navigator should you have any questions/concerns: Marchia Bond, Cardiac Imaging Nurse Navigator Gordy Clement, Cardiac Imaging Nurse Navigator [facility] Direct Office Dial: [phone]   For scheduling needs, including cancellations and rescheduling, please call [place], [phone].    Follow-Up: At [facility], you and your health needs are our priority.  As part of our continuing mission to provide you with exceptional heart care, we have created designated Provider Care Teams.  These Care Teams include your primary Cardiologist (physician) and Advanced Practice Providers (APPs -  Physician Assistants and Nurse Practitioners) who all work together to provide you with the care you need, when you need it.  We recommend signing up for the patient portal called "MyChart".  Sign up information is provided on this After Visit Summary.  MyChart is used to connect with patients for Virtual Visits (Telemedicine).  Patients are able to view lab/test results, encounter notes, upcoming appointments, etc.  Non-urgent messages can be sent to your provider as well.   To learn more about what you can do with MyChart, go to [email or web address].    Your next appointment:   20 week(s)  Provider:   Berniece Salines, DO       Adopting a Healthy Lifestyle.  Know what a healthy weight is for you (roughly BMI <25) and aim to maintain this   Aim for 7+ servings of fruits and vegetables daily    65-80+ fluid ounces of water or unsweet tea for healthy kidneys   Limit to max 1 drink of alcohol per day; avoid smoking/tobacco   Limit animal fats in diet for cholesterol and heart health - choose grass fed whenever available   Avoid highly processed foods, and foods high in saturated/trans fats   Aim for low stress - take time to unwind and care for your mental health   Aim for 150 min of moderate intensity exercise weekly for heart health, and weights twice weekly for bone health   Aim for 7-9 hours of sleep daily   When it comes to diets, agreement about the perfect plan isnt easy to find, even among the experts. Experts at the [employer] developed an idea known as the Healthy Eating Plate. Just imagine a plate divided into logical, healthy portions.   The emphasis is on diet quality:   Load up on vegetables and fruits - one-half of your plate: Aim for color and variety, and remember that potatoes dont count.   Go for whole grains - one-quarter of your plate: Whole wheat, barley, wheat berries, quinoa, oats, brown rice, and foods made with them. If you want pasta, go with whole wheat pasta.   Protein power - one-quarter of your plate: Fish, chicken, beans, and nuts are all healthy, versatile protein sources. Limit red meat.   The diet, however, does go beyond the plate, offering a few other suggestions.   Use healthy plant oils, such as olive, canola, soy, corn, sunflower and peanut. Check the labels, and avoid partially hydrogenated oil, which have unhealthy trans fats.   If youre thirsty, drink water. Coffee and tea are good in moderation, but skip sugary drinks and limit milk and dairy products to one or two daily servings.   The type of carbohydrate in the diet is more important than the amount. Some sources of carbohydrates, such as vegetables, fruits, whole  grains, and beans-are healthier than others.   Finally, stay active  Signed, Berniece Salines, DO  08/10/2022 7:58 PM    [facility]

## 2022-08-10 LAB — BASIC METABOLIC PANEL
BUN/Creatinine Ratio: 10 (ref 9–23)
BUN: 7 mg/dL (ref 6–24)
CO2: 25 mmol/L (ref 20–29)
Calcium: 9.4 mg/dL (ref 8.7–10.2)
Chloride: 99 mmol/L (ref 96–106)
Creatinine, Ser: 0.73 mg/dL (ref 0.57–1.00)
Glucose: 94 mg/dL (ref 70–99)
Potassium: 4.8 mmol/L (ref 3.5–5.2)
Sodium: 135 mmol/L (ref 134–144)
eGFR: 105 mL/min/{1.73_m2} (ref >59)

## 2022-08-10 LAB — MAGNESIUM: Magnesium: 2 mg/dL (ref 1.6–2.3)

## 2022-08-13 ENCOUNTER — Encounter: Payer: Self-pay | Admitting: Cardiology

## 2022-08-14 ENCOUNTER — Encounter: Payer: Self-pay | Admitting: Cardiology

## 2022-08-15 ENCOUNTER — Telehealth (HOSPITAL_COMMUNITY): Payer: Self-pay | Admitting: *Deleted

## 2022-08-15 NOTE — Telephone Encounter (Signed)
Please see other mychart encounter.

## 2022-08-15 NOTE — Telephone Encounter (Signed)
Patient calling about her upcoming cardiac imaging study; pt verbalizes understanding of appt date/time, parking situation and where to check in, pre-test NPO status and medications ordered, and verified current allergies; name and call back number provided for further questions should they arise  Gordy Clement RN Navigator Cardiac Imaging [facility] [phone] office [phone] cell  Patient aware to avoid her adderall and take '100mg'$  metoprolol tartrate two hours prior to her cardiac CT scan. She is to arrive at 11:30am.

## 2022-08-17 ENCOUNTER — Ambulatory Visit (HOSPITAL_COMMUNITY)
Admission: RE | Admit: 2022-08-17 | Discharge: 2022-08-17 | Disposition: A | Discharge status: Home / Self Care | Payer: Medicaid Other | Source: Ambulatory Visit | Attending: Cardiology | Admitting: Cardiology

## 2022-08-17 DIAGNOSIS — R072 Precordial pain: Secondary | ICD-10-CM | POA: Insufficient documentation

## 2022-08-17 MED ORDER — NITROGLYCERIN 0.4 MG SL SUBL
SUBLINGUAL_TABLET | SUBLINGUAL | Status: AC
  Filled 2022-08-17: qty 2

## 2022-08-17 MED ORDER — IOHEXOL 350 MG/ML SOLN
100.0000 mL | Freq: Once | INTRAVENOUS | Status: AC | PRN
  Administered 2022-08-17: 100 mL via INTRAVENOUS

## 2022-08-17 MED ORDER — NITROGLYCERIN 0.4 MG SL SUBL
0.8000 mg | SUBLINGUAL_TABLET | Freq: Once | SUBLINGUAL | Status: AC
  Administered 2022-08-17: 0.8 mg via SUBLINGUAL

## 2022-09-08 ENCOUNTER — Ambulatory Visit: Payer: Medicaid Other | Attending: Cardiology | Admitting: Cardiology

## 2022-09-08 VITALS — BP 125/87 | HR 88 | Ht 64.0 in | Wt 171.0 lb

## 2022-09-08 DIAGNOSIS — I77810 Thoracic aortic ectasia: Secondary | ICD-10-CM

## 2022-09-08 DIAGNOSIS — I2510 Atherosclerotic heart disease of native coronary artery without angina pectoris: Secondary | ICD-10-CM | POA: Diagnosis present

## 2022-09-08 DIAGNOSIS — I10 Essential (primary) hypertension: Secondary | ICD-10-CM | POA: Diagnosis not present

## 2022-09-08 NOTE — Progress Notes (Signed)
Virtual Visit via Video Note   Because of Sheri Chapman's co-morbid illnesses, she is at least at moderate risk for complications without adequate follow up.  This format is felt to be most appropriate for this patient at this time.  All issues noted in this document were discussed and addressed.  A limited physical exam was performed with this format.  Please refer to the patient's chart for her consent to telehealth for [facility].      Date:  09/08/2022   ID:  Sheri Chapman, DOB 04-May-1981, MRN [id number]  Patient Location: Home Provider Location: Office/Clinic    PCP:  Lise Auer, MD  Cardiologist:  Thomasene Ripple, DO  Electrophysiologist:  None   Evaluation Performed:  Follow-Up Visit  Chief Complaint:  " I am   History of Present Illness:    Sheri Chapman is a 42 y.o. female with anxiety, depression, thoracic aortic aneurysm, here today to discuss her coronary CT scan results.  She offers no complaints at this time.  The patient does not have symptoms concerning for COVID-19 infection (fever, chills, cough, or new shortness of breath).    Past Medical History:  Diagnosis Date   ADHD    Anxiety    Depression    Thoracic aortic aneurysm (HCC)    Past Surgical History:  Procedure Laterality Date   HIP DEBRIDEMENT       Current Meds  Medication Sig   amphetamine-dextroamphetamine (ADDERALL) 30 MG tablet Take 1 tablet by mouth 2 (two) times daily.   ARIPiprazole (ABILIFY) 10 MG tablet Take 10 mg by mouth daily.   carvedilol (COREG) 6.25 MG tablet Take 1 tablet (6.25 mg total) by mouth 2 (two) times daily.   terbinafine (LAMISIL) 250 MG tablet Take 250 mg by mouth daily.   venlafaxine XR (EFFEXOR-XR) 75 MG 24 hr capsule Take 75 mg by mouth daily.     Allergies:   Patient has no known allergies.   Social History   Tobacco Use   Smoking status: Every Day    Packs/day: 1.00    Years: 11.00    Additional pack years: 0.00    Total pack years:  11.00    Types: Cigarettes   Smokeless tobacco: Never  Substance Use Topics   Alcohol use: No   Drug use: No     Family Hx: The patient's family history includes Diabetes in her father, maternal grandfather, and paternal grandmother; Heart disease in her maternal grandmother.  ROS:   Please see the history of present illness.     All other systems reviewed and are negative.   Prior CV studies:   The following studies were reviewed today:  CCTA 08/17/2022 ADDENDUM: The following report is an over-read performed by radiologist Dr.Jeffrey Caprice Beaver of [facility] on August 18, 2022. This over-read does not include interpretation of cardiac or coronary anatomy or pathology. The coronary calcium score/coronary CTA interpretation by the cardiologist is attached.   COMPARISON:  Chest CT July 26, 2022   FINDINGS: Vascular: Similar aneurysmal dilation of the ascending thoracic aorta measuring 4.1 cm.   Mediastinum/Nodes: No pathologically enlarged mediastinal, or hilar lymph nodes in the visualized portions of the chest. The distal esophagus is grossly unremarkable.   Lungs/Pleura: Within the visualized portions of the thorax there are no suspicious appearing pulmonary nodules or masses, there is no acute consolidative airspace disease, no pleural effusions and no pneumothorax   Upper Abdomen: Visualized portions of the upper abdomen  are unremarkable.   Musculoskeletal: No acute osseous abnormality.   IMPRESSION: Similar aneurysmal dilation of the ascending thoracic aorta measuring 4.1 cm in diameter.     Electronically Signed   By: Maudry Mayhew M.D.   On: 08/18/2022 08:10    Addended by Dimas Alexandria, MD on 08/18/2022  8:12 AM  ADDENDUM REPORT: 08/17/2022 16:59   IMPRESSION: 1. There is minimal (<25%) soft plaque in the proximal LAD. Coronary calcium score of 0. This was 1st percentile for age-, race-, and sex-matched controls.   2. Normal coronary origin with  right dominance.   3. Ascending aorta mildly dilated.  3.9 cm.   Tiffany C. Duke Salvia, MD     Electronically Signed   By: Chilton Si M.D.   On: 08/17/2022 16:59    Addended by Chilton Si, MD on 08/17/2022  5:02 PM    Study Result  Narrative & Impression  CLINICAL DATA:  88F with hypertension, aortic aneurysm and chest pain.   EXAM: Cardiac/Coronary  CT   TECHNIQUE: The patient was scanned on a [employer].   FINDINGS: A 120 kV prospective scan was triggered in the descending thoracic aorta at 111 HU's. Axial non-contrast 3 mm slices were carried out through the heart. The data set was analyzed on a dedicated workstation and scored using the Agatson method. Gantry rotation speed was 250 msecs and collimation was .6 mm. No beta blockade and 0.8 mg of sl NTG was given. The 3D data set was reconstructed in 5% intervals of the 67-82 % of the R-R cycle. Diastolic phases were analyzed on a dedicated work station using MPR, MIP and VRT modes. The patient received 80 cc of contrast.   Aorta: NormAscending aorta mildly dilated. 3.9 cm. No calcifications. No dissection.   Aortic Valve:  Trileaflet.  No calcifications.   Coronary Arteries:  Normal coronary origin.  Right dominance.   RCA is a large dominant artery that gives rise to PDA and two PLV branches. There is no plaque.   Left main is a large artery that gives rise to LAD and LCX arteries.   LAD is a large vessel that has minimal (<25%) soft plaque proximally. There is a Heiler D1 and normal D2.   LCX is a non-dominant artery.  There is no plaque.   Coronary Calcium Score: 0   Other findings:   Normal pulmonary vein drainage into the left atrium.   Normal let atrial appendage without a thrombus.   Normal size of the pulmonary artery.   IMPRESSION: 1. Coronary calcium score of 0. This was 1st percentile for age-,race-, and sex-matched controls.   2. Normal coronary origin with right  dominance.   3. No evidence of CAD.  CAD-RADS 0.   Chilton Si, MD   Electronically Signed: By: Chilton Si M.D. On: 08/17/2022 16:54     Labs/Other Tests and Data Reviewed:    EKG:  No ECG reviewed.  Recent Labs: 07/26/2022: ALT 23; Hemoglobin 15.6; Platelets 359 08/09/2022: BUN 7; Creatinine, Ser 0.73; Magnesium 2.0; Potassium 4.8; Sodium 135   Recent Lipid Panel No results found for: "CHOL", "TRIG", "HDL", "CHOLHDL", "LDLCALC", "LDLDIRECT"  Wt Readings from Last 3 Encounters:  09/08/22 171 lb (77.6 kg)  08/09/22 169 lb 3.2 oz (76.7 kg)  07/26/22 168 lb (76.2 kg)     Objective:    Vital Signs:  BP 125/87   Pulse 88   Ht 5\' 4"  (1.626 m)   Wt 171 lb (77.6 kg)  BMI 29.35 kg/m     ASSESSMENT & PLAN:    Minimal CAD  Hypertension Dilatation of the thoracic aorta.  We talked about her result from the CCTA showing minimal coronary artery disease.  I explained to the patient what this means. Her blood pressure is acceptable we will continue the Coreg. Repeat CT scan in 1 year for her aortic dilatation.    Discussed monitor results.  COVID-19 Education: The signs and symptoms of COVID-19 were discussed with the patient and how to seek care for testing (follow up with PCP or arrange E-visit).  The importance of social distancing was discussed today.  Time:   Today, I have spent 12 minutes with the patient with telehealth technology discussing the above problems.     Medication Adjustments/Labs and Tests Ordered: Current medicines are reviewed at length with the patient today.  Concerns regarding medicines are outlined above.   Tests Ordered: No orders of the defined types were placed in this encounter.   Medication Changes: No orders of the defined types were placed in this encounter.   Follow Up:  In Person in 12 month(s)  Signed, Thomasene Ripple, DO  09/08/2022 4:42 PM    [facility]

## 2022-09-08 NOTE — Patient Instructions (Signed)
Medication Instructions:  Your physician recommends that you continue on your current medications as directed. Please refer to the Current Medication list given to you today.  *If you need a refill on your cardiac medications before your next appointment, please call your pharmacy*   Lab Work: None   Testing/Procedures: None   Follow-Up: At [facility], you and your health needs are our priority.  As part of our continuing mission to provide you with exceptional heart care, we have created designated Provider Care Teams.  These Care Teams include your primary Cardiologist (physician) and Advanced Practice Providers (APPs -  Physician Assistants and Nurse Practitioners) who all work together to provide you with the care you need, when you need it.   Your next appointment:   1 year(s)  Provider:   Kardie Tobb, DO  

## 2023-04-06 ENCOUNTER — Telehealth: Payer: Self-pay | Admitting: Cardiology

## 2023-04-06 NOTE — Telephone Encounter (Signed)
*  STAT* If patient is at the pharmacy, call can be transferred to refill team.   1. Which medications need to be refilled? (please list name of each medication and dose if known) carvedilol (COREG) 6.25 MG tablet   2. Which pharmacy/location (including street and city if local pharmacy) is medication to be sent to?  [id number] - [place], [place] - [address]      3. Do they need a 30 day or 90 day supply? 90 day    Pt is out of medication

## 2023-04-11 ENCOUNTER — Other Ambulatory Visit: Payer: Self-pay

## 2023-04-11 MED ORDER — CARVEDILOL 6.25 MG PO TABS: 6.2500 mg | ORAL_TABLET | Freq: Two times a day (BID) | ORAL | 3 refills | Status: AC

## 2023-04-11 NOTE — Telephone Encounter (Signed)
Spoke to patient advised Carvedilol refill sent to her pharmacy.

## 2023-04-11 NOTE — Telephone Encounter (Signed)
Patient is following up. She states she is completely out of medication today.

## 2023-05-06 IMAGING — CT CT ANGIO CHEST-ABD-PELV FOR DISSECTION W/ AND WO/W CM
2 of 7 series · 14 of 46 positions shown, 16 images · IV contrast (APPLIED)
Comparison: CT abdomen pelvis dated 07/07/2016.

CLINICAL DATA: Chest and back pain.  Concern for aortic dissection.

EXAM:
CT ANGIOGRAPHY CHEST, ABDOMEN AND PELVIS
TECHNIQUE: Non-contrast CT of the chest was initially obtained.

[Series 6: arterial · axial · arterial · 0.84mm/px · z∈[+809,+1375]mm · 11 of 317 slices shown, 13 images]
[im 17/317  soft-tissue]
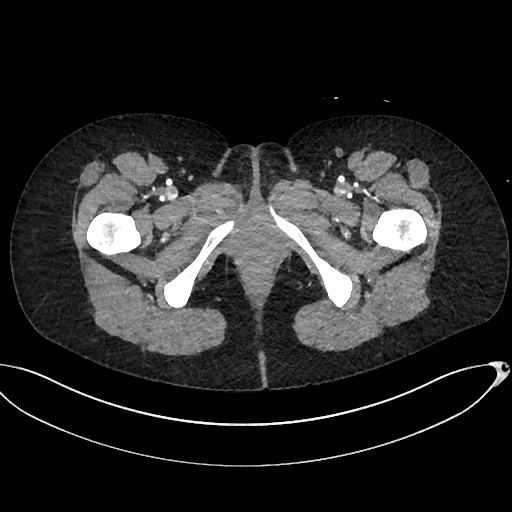
[im 17/317  bone]
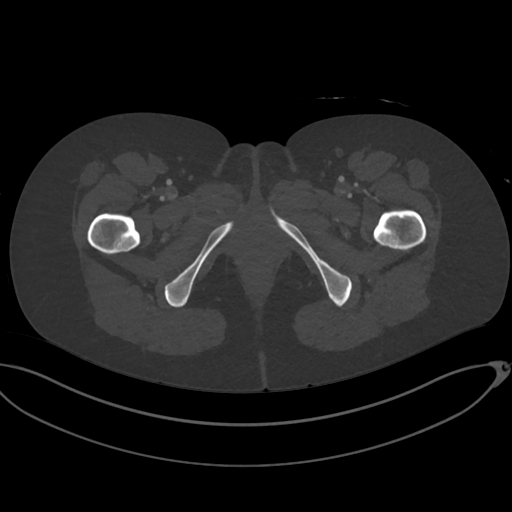
[im 50/317  soft-tissue]
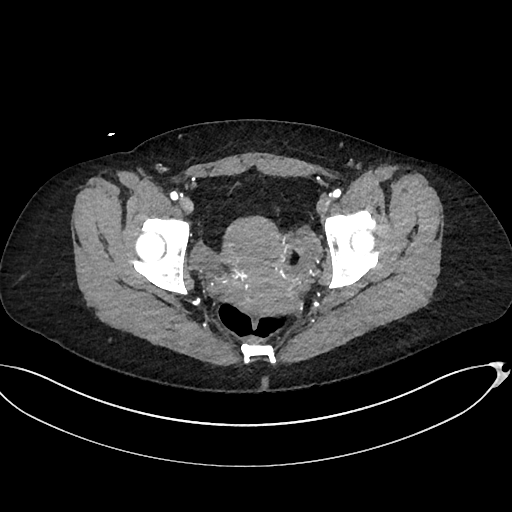
[im 84/317  soft-tissue]
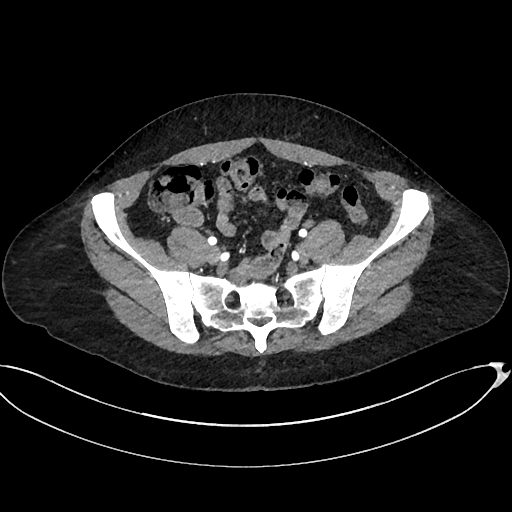
[im 100/317  soft-tissue]
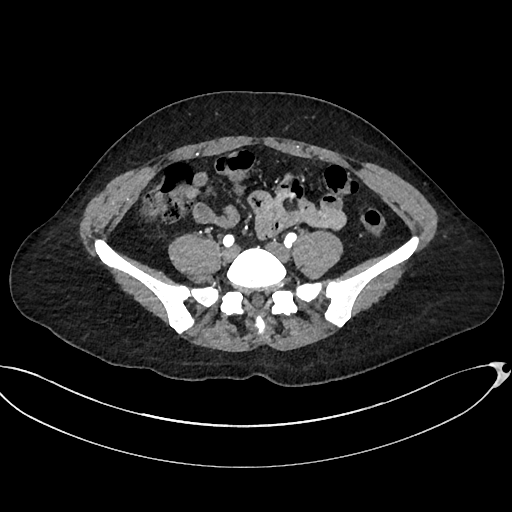
[im 134/317  soft-tissue]
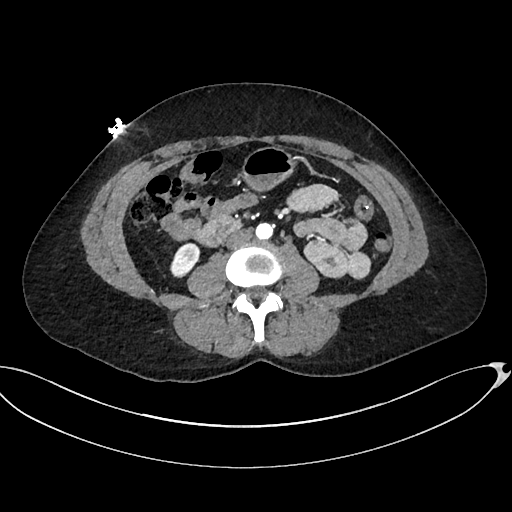
[im 167/317  soft-tissue]
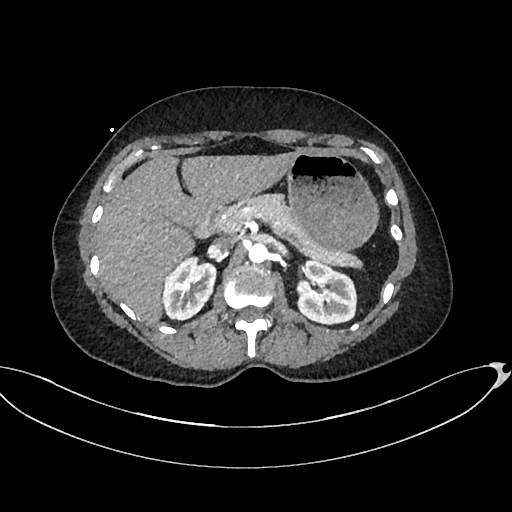
[im 183/317  soft-tissue]
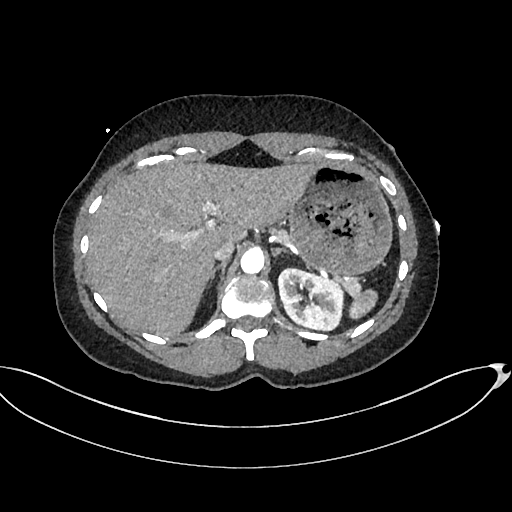
[im 217/317  soft-tissue]
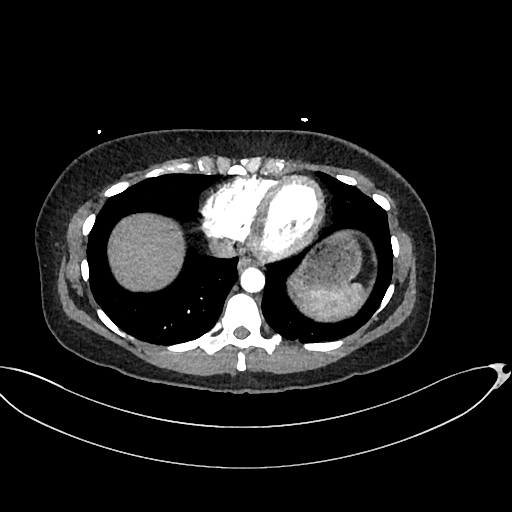
[im 233/317  soft-tissue]
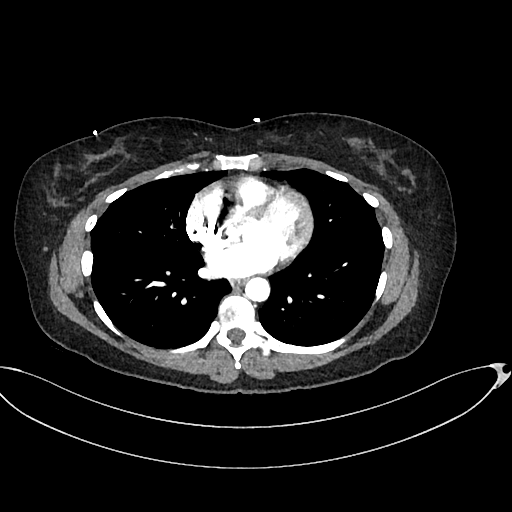
[im 233/317  bone]
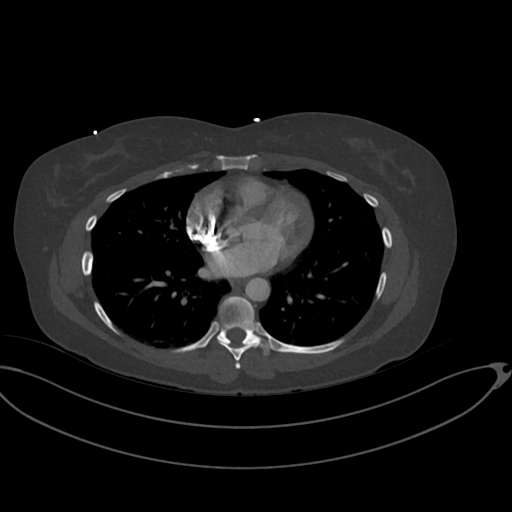
[im 267/317  soft-tissue]
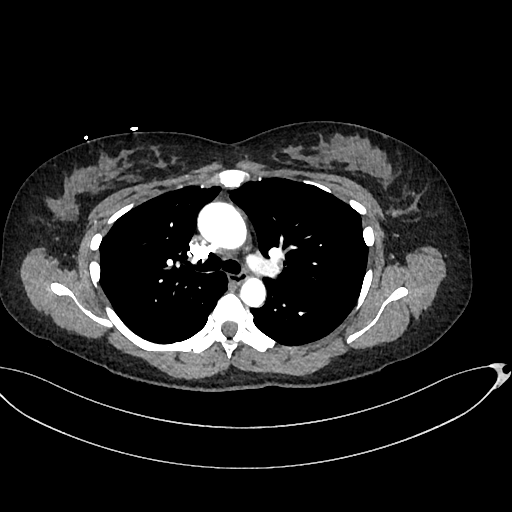
[im 300/317  soft-tissue]
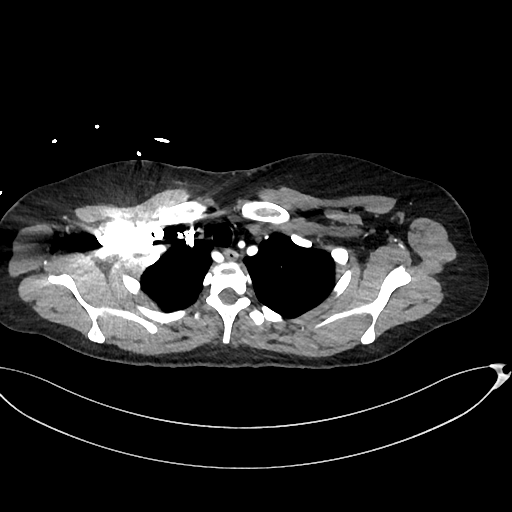

[Series 9: cor · coronal · 0.76mm/px · 3 of 132 slices shown]
[im 33/132  soft-tissue]
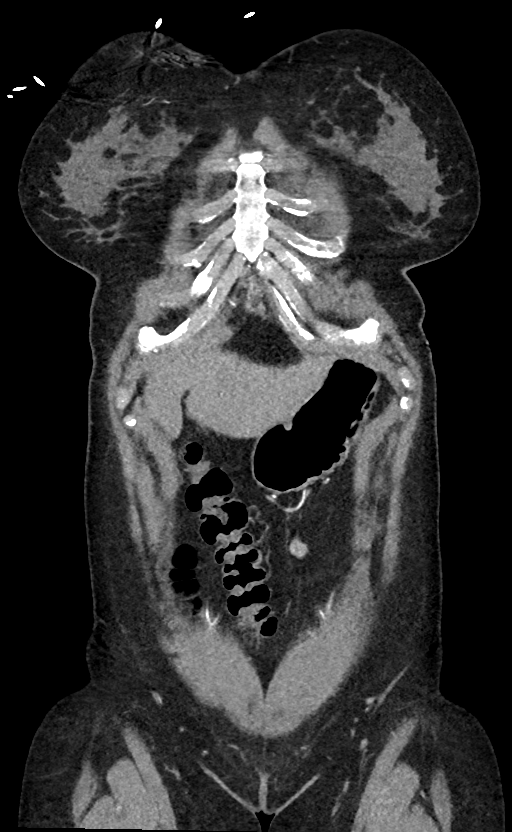
[im 66/132  soft-tissue]
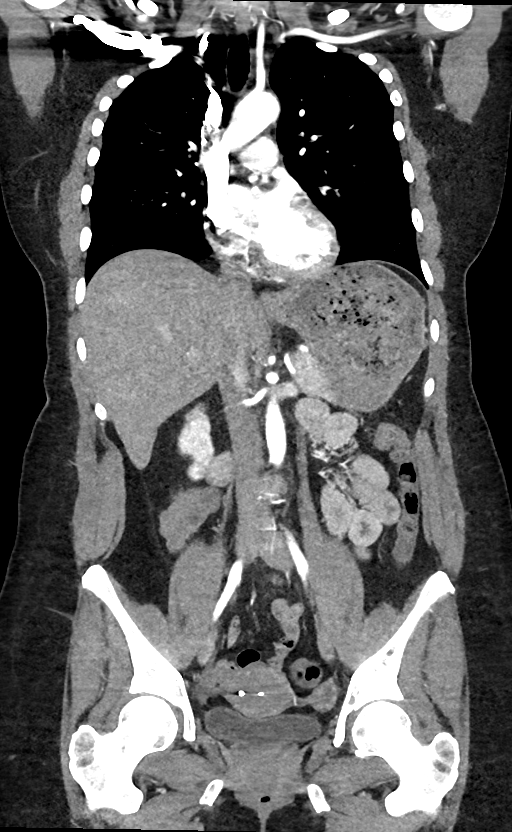
[im 99/132  soft-tissue]
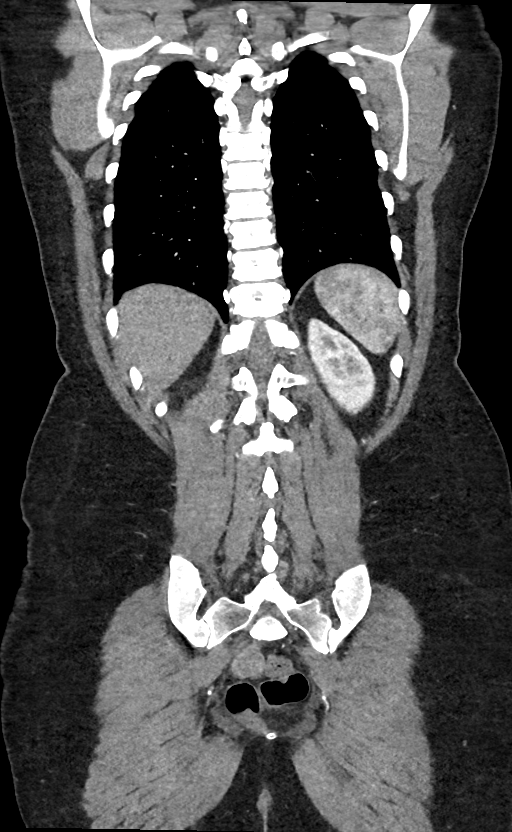

[14 of 46 positions shown; findings below may reference images not displayed]

Multidetector CT imaging through the chest, abdomen and pelvis was
performed using the standard protocol during bolus administration of
intravenous contrast. Multiplanar reconstructed images and MIPs were
obtained and reviewed to evaluate the vascular anatomy.

RADIATION DOSE REDUCTION: This exam was performed according to the
departmental dose-optimization program which includes automated
exposure control, adjustment of the mA and/or kV according to
patient size and/or use of iterative reconstruction technique.

CONTRAST:  100mL OMNIPAQUE IOHEXOL 350 MG/ML SOLN
FINDINGS: CTA CHEST FINDINGS

Cardiovascular: There is no cardiomegaly or pericardial effusion.
The thoracic aorta is unremarkable. There is a left-sided aortic
arch with aberrant right subclavian artery anatomy. The origins of
the great vessels of the aortic arch appear patent. The central
pulmonary arteries appear unremarkable for the degree of
opacification.

Mediastinum/Nodes: No hilar or mediastinal adenopathy. The esophagus
is grossly unremarkable. No mediastinal fluid collection.

Lungs/Pleura: The lungs are clear. There is no pleural effusion or
pneumothorax. The central airways are patent.

Musculoskeletal: No chest wall abnormality. No acute or significant
osseous findings.

Review of the MIP images confirms the above findings.

CTA ABDOMEN AND PELVIS FINDINGS

VASCULAR

Aorta: Normal caliber aorta without aneurysm, dissection, vasculitis
or significant stenosis.

Celiac: Patent without evidence of aneurysm, dissection, vasculitis
or significant stenosis.

SMA: Patent without evidence of aneurysm, dissection, vasculitis or
significant stenosis.

Renals: Both renal arteries are patent without evidence of aneurysm,
dissection, vasculitis, fibromuscular dysplasia or significant
stenosis.

IMA: Patent without evidence of aneurysm, dissection, vasculitis or
significant stenosis.

Inflow: Patent without evidence of aneurysm, dissection, vasculitis
or significant stenosis.

Veins: No obvious venous abnormality within the limitations of this
arterial phase study.

Review of the MIP images confirms the above findings.

NON-VASCULAR

No intra-abdominal free air or free fluid.

Hepatobiliary: No focal liver abnormality is seen. No gallstones,
gallbladder wall thickening, or biliary dilatation.

Pancreas: Unremarkable. No pancreatic ductal dilatation or
surrounding inflammatory changes.

Spleen: Normal in size without focal abnormality.

Adrenals/Urinary Tract: Adrenal glands are unremarkable. Kidneys are
normal, without renal calculi, focal lesion, or hydronephrosis.
Bladder is unremarkable.

Stomach/Bowel: There is no bowel obstruction or active inflammation.
The appendix is normal.

Lymphatic: No adenopathy.

Reproductive: The uterus is anteverted. An intrauterine device is
noted. No adnexal masses.

Other: None

Musculoskeletal: No acute or significant osseous findings.

Review of the MIP images confirms the above findings.
IMPRESSION: 1. No acute intrathoracic, abdominal, or pelvic pathology. No aortic
dissection.
2. Left-sided aortic arch with aberrant right subclavian artery
anatomy.

## 2023-09-07 NOTE — Progress Notes (Deleted)
 Cardiology Clinic Note   Patient Name: Sheri Chapman Date of Encounter: 09/07/2023  Primary Care Provider:  Beecher Bower, MD Primary Cardiologist:  Kardie Tobb, DO  Patient Profile    Sheri Chapman 43 year old female presents to the clinic today for follow-up evaluation of her coronary artery disease and aortic dilation.  Past Medical History    Past Medical History:  Diagnosis Date   ADHD    Anxiety    Depression    Thoracic aortic aneurysm [facility])    Past Surgical History:  Procedure Laterality Date   HIP DEBRIDEMENT      Allergies  No Known Allergies  History of Present Illness    Sheri Chapman has a PMH of coronary artery disease, ascending aortic dilation, HTN.  She underwent coronary CTA 3/24 which showed a coronary calcium score of 0.  She was noted to have minimal less than 25% soft plaque along her proximal LAD.  Chest CT 2/24 showed ascending thoracic aortic aneurysm measuring 4.1 cm.  She was seen in follow-up by Dr. Emmette Harms on 09/08/2022.  Her coronary CTA results were discussed.  She denied cardiac concerns.  Follow-up was planned for 1 year and repeat chest CT in 1 year for evaluation of her aortic dilation was discussed.  She presents to the clinic today for follow-up evaluation and states***.  *** denies chest pain, shortness of breath, lower extremity edema, fatigue, palpitations, melena, hematuria, hemoptysis, diaphoresis, weakness, presyncope, syncope, orthopnea, and PND.  Thoracic aortic aneurysm-denies chest and back discomfort.  Underwent chest CT 2/24 which showed a ascending thoracic aortic aneurysm measuring 4.1 cm. Maintain good blood pressure control Heart healthy low-sodium diet Repeat CT angio chest aorta  Coronary artery disease-denies recent anginal type symptoms.  Coronary CTA 3/24 showed a coronary calcium score of 0.  She was noted to have minimal less than 25% soft plaque along her proximal LAD. High-fiber diet Increase physical  activity as tolerated Continue carvedilol   Essential hypertension-BP today***. Maintain blood pressure log Heart healthy low-sodium diet Continue carvedilol   Disposition: Follow-up with Dr. Emmette Harms or me in 1 year.   Home Medications    Prior to Admission medications   Medication Sig Start Date End Date Taking? Authorizing Provider  amphetamine-dextroamphetamine (ADDERALL) 30 MG tablet Take 1 tablet by mouth 2 (two) times daily. 09/12/21   [provider]  ARIPiprazole (ABILIFY) 10 MG tablet Take 10 mg by mouth daily. 07/17/17   [provider]  carvedilol  (COREG ) 6.25 MG tablet Take 1 tablet (6.25 mg total) by mouth 2 (two) times daily. 04/11/23   Tobb, Kardie, DO  terbinafine (LAMISIL) 250 MG tablet Take 250 mg by mouth daily.    [provider]  venlafaxine XR (EFFEXOR-XR) 75 MG 24 hr capsule Take 75 mg by mouth daily. 07/17/17   [provider]    Family History    Family History  Problem Relation Age of Onset   Diabetes Father    Heart disease Maternal Grandmother    Diabetes Maternal Grandfather    Diabetes Paternal Grandmother    She indicated that the status of her father is unknown. She indicated that the status of her maternal grandmother is unknown. She indicated that the status of her maternal grandfather is unknown. She indicated that the status of her paternal grandmother is unknown.  Social History    Social History   Socioeconomic History   Marital status: Divorced    Spouse name: Not on file  Number of children: Not on file   Years of education: Not on file   Highest education level: Not on file  Occupational History   Not on file  Tobacco Use   Smoking status: Every Day    Current packs/day: 1.00    Average packs/day: 1 pack/day for 11.0 years (11.0 ttl pk-yrs)    Types: Cigarettes   Smokeless tobacco: Never  Substance and Sexual Activity   Alcohol use: No   Drug use: No   Sexual activity: Not on file  Other Topics  Concern   Not on file  Social History Narrative   Not on file   Social Drivers of Health   Financial Resource Strain: Not on file  Food Insecurity: Not on file  Transportation Needs: Not on file  Physical Activity: Not on file  Stress: Not on file  Social Connections: Not on file  Intimate Partner Violence: Not on file     Review of Systems    General:  No chills, fever, night sweats or weight changes.  Cardiovascular:  No chest pain, dyspnea on exertion, edema, orthopnea, palpitations, paroxysmal nocturnal dyspnea. Dermatological: No rash, lesions/masses Respiratory: No cough, dyspnea Urologic: No hematuria, dysuria Abdominal:   No nausea, vomiting, diarrhea, bright red blood per rectum, melena, or hematemesis Neurologic:  No visual changes, wkns, changes in mental status. All other systems reviewed and are otherwise negative except as noted above.  Physical Exam    VS:  There were no vitals taken for this visit. , BMI There is no height or weight on file to calculate BMI. GEN: Well nourished, well developed, in no acute distress. HEENT: normal. Neck: Supple, no JVD, carotid bruits, or masses. Cardiac: RRR, no murmurs, rubs, or gallops. No clubbing, cyanosis, edema.  Radials/DP/PT 2+ and equal bilaterally.  Respiratory:  Respirations regular and unlabored, clear to auscultation bilaterally. GI: Soft, nontender, nondistended, BS + x 4. MS: no deformity or atrophy. Skin: warm and dry, no rash. Neuro:  Strength and sensation are intact. Psych: Normal affect.  Accessory Clinical Findings    Recent Labs: No results found for requested labs within last 365 days.   Recent Lipid Panel No results found for: "CHOL", "TRIG", "HDL", "CHOLHDL", "VLDL", "LDLCALC", "LDLDIRECT"  No BP recorded.  {Refresh Note OR Click here to enter BP  :1}***    ECG personally reviewed by me today- ***    CT angio chest 07/26/2022    FINDINGS: Cardiovascular: Satisfactory opacification of  the pulmonary arteries to the segmental level. No evidence of pulmonary embolism. Unchanged 4.1 cm ascending aortic aneurysm (image 55 series 8). Aberrant right subclavian artery.   Mediastinum/Nodes: No enlarged mediastinal, hilar, or axillary lymph nodes. Thyroid gland, trachea, and esophagus demonstrate no significant findings.   Lungs/Pleura: Lungs are clear. No pleural effusion or pneumothorax.   Upper Abdomen: No acute abnormality.   Musculoskeletal: Mild sclerosis of the lateral left fifth and right seventh ribs, favored to represent healed rib fractures. No acute bony abnormality.   Review of the MIP images confirms the above findings.   IMPRESSION: 1. No evidence of pulmonary embolism or other acute findings in the chest. 2. Unchanged 4.1 cm ascending aortic aneurysm.     Electronically Signed   By: Audra Blend M.D.   On: 07/26/2022 11:58  Coronary CTA 08/17/2022  FINDINGS: Vascular: Similar aneurysmal dilation of the ascending thoracic aorta measuring 4.1 cm.   Mediastinum/Nodes: No pathologically enlarged mediastinal, or hilar lymph nodes in the visualized portions of the chest.  The distal esophagus is grossly unremarkable.   Lungs/Pleura: Within the visualized portions of the thorax there are no suspicious appearing pulmonary nodules or masses, there is no acute consolidative airspace disease, no pleural effusions and no pneumothorax   Upper Abdomen: Visualized portions of the upper abdomen are unremarkable.   Musculoskeletal: No acute osseous abnormality.   IMPRESSION: Similar aneurysmal dilation of the ascending thoracic aorta measuring 4.1 cm in diameter.     Electronically Signed   By: Tama Fails M.D.   On: 08/18/2022 08:10    Addended by Campbell Centers, MD on 08/18/2022  8:12 AM  ADDENDUM REPORT: 08/17/2022 16:59   IMPRESSION: 1. There is minimal (<25%) soft plaque in the proximal LAD. Coronary calcium score of 0. This was 1st  percentile for age-, race-, and sex-matched controls.   2. Normal coronary origin with right dominance.   3. Ascending aorta mildly dilated.  3.9 cm.   Tiffany C. Theodis Fiscal, MD     Electronically Signed   By: Maudine Sos M.D.   On: 08/17/2022 16:59      Assessment & Plan   1.  ***   Chet Cota. Candise Crabtree NP-C     09/07/2023, 7:19 AM [facility] [address] Office [phone] Fax [phone]    I spent***minutes examining this patient, reviewing medications, and using patient centered shared decision making involving their cardiac care.   I spent  20 minutes reviewing past medical history,  medications, and prior cardiac tests.

## 2023-09-11 ENCOUNTER — Ambulatory Visit: Payer: Medicaid Other | Admitting: Adult Health

## 2023-09-11 ENCOUNTER — Ambulatory Visit: Payer: Self-pay | Admitting: General Practice

## 2023-10-18 NOTE — Progress Notes (Deleted)
 Cardiology Clinic Note   Patient Name: WILFRED SIVERSON Date of Encounter: 10/18/2023  Primary Care Provider:  Beecher Bower, MD Primary Cardiologist:  Kardie Tobb, DO  Patient Profile    Charlcie Prisco Rutkowski 43 year old female presents to the clinic today for follow-up evaluation of her coronary artery disease and aortic dilation.   Past Medical History    Past Medical History:  Diagnosis Date   ADHD    Anxiety    Depression    Thoracic aortic aneurysm [facility])    Past Surgical History:  Procedure Laterality Date   HIP DEBRIDEMENT      Allergies  No Known Allergies  History of Present Illness    Rella Egelston Force has a PMH of coronary artery disease, ascending aortic dilation, HTN.  She underwent coronary CTA 3/24 which showed a coronary calcium score of 0.  She was noted to have minimal less than 25% soft plaque along her proximal LAD.  Chest CT 2/24 showed ascending thoracic aortic aneurysm measuring 4.1 cm.   She was seen in follow-up by Dr. Emmette Harms on 09/08/2022.  Her coronary CTA results were discussed.  She denied cardiac concerns.  Follow-up was planned for 1 year and repeat chest CT in 1 year for evaluation of her aortic dilation was discussed.   She presents to the clinic today for follow-up evaluation and states***.   *** denies chest pain, shortness of breath, lower extremity edema, fatigue, palpitations, melena, hematuria, hemoptysis, diaphoresis, weakness, presyncope, syncope, orthopnea, and PND.   Thoracic aortic aneurysm-denies chest and back discomfort.  Underwent chest CT 2/24 which showed a ascending thoracic aortic aneurysm measuring 4.1 cm. Maintain good blood pressure control Heart healthy low-sodium diet Repeat CT angio chest aorta   Coronary artery disease-denies recent anginal type symptoms.  Coronary CTA 3/24 showed a coronary calcium score of 0.  She was noted to have minimal less than 25% soft plaque along her proximal LAD. High-fiber diet Increase  physical activity as tolerated Continue carvedilol    Essential hypertension-BP today***. Maintain blood pressure log Heart healthy low-sodium diet Continue carvedilol    Disposition: Follow-up with Dr. Emmette Harms or me in 1 year.  Home Medications    Prior to Admission medications   Medication Sig Start Date End Date Taking? Authorizing Provider  amphetamine-dextroamphetamine (ADDERALL) 30 MG tablet Take 1 tablet by mouth 2 (two) times daily. 09/12/21   [provider]  ARIPiprazole (ABILIFY) 10 MG tablet Take 10 mg by mouth daily. 07/17/17   [provider]  carvedilol  (COREG ) 6.25 MG tablet Take 1 tablet (6.25 mg total) by mouth 2 (two) times daily. 04/11/23   Tobb, Kardie, DO  terbinafine (LAMISIL) 250 MG tablet Take 250 mg by mouth daily.    [provider]  venlafaxine XR (EFFEXOR-XR) 75 MG 24 hr capsule Take 75 mg by mouth daily. 07/17/17   [provider]    Family History    Family History  Problem Relation Age of Onset   Diabetes Father    Heart disease Maternal Grandmother    Diabetes Maternal Grandfather    Diabetes Paternal Grandmother    She indicated that the status of her father is unknown. She indicated that the status of her maternal grandmother is unknown. She indicated that the status of her maternal grandfather is unknown. She indicated that the status of her paternal grandmother is unknown.  Social History    Social History   Socioeconomic History   Marital status: Divorced  Spouse name: Not on file   Number of children: Not on file   Years of education: Not on file   Highest education level: Not on file  Occupational History   Not on file  Tobacco Use   Smoking status: Every Day    Current packs/day: 1.00    Average packs/day: 1 pack/day for 11.0 years (11.0 ttl pk-yrs)    Types: Cigarettes   Smokeless tobacco: Never  Substance and Sexual Activity   Alcohol use: No   Drug use: No   Sexual activity: Not on file  Other  Topics Concern   Not on file  Social History Narrative   Not on file   Social Drivers of Health   Financial Resource Strain: Not on file  Food Insecurity: Not on file  Transportation Needs: Not on file  Physical Activity: Not on file  Stress: Not on file  Social Connections: Not on file  Intimate Partner Violence: Not on file     Review of Systems    General:  No chills, fever, night sweats or weight changes.  Cardiovascular:  No chest pain, dyspnea on exertion, edema, orthopnea, palpitations, paroxysmal nocturnal dyspnea. Dermatological: No rash, lesions/masses Respiratory: No cough, dyspnea Urologic: No hematuria, dysuria Abdominal:   No nausea, vomiting, diarrhea, bright red blood per rectum, melena, or hematemesis Neurologic:  No visual changes, wkns, changes in mental status. All other systems reviewed and are otherwise negative except as noted above.  Physical Exam    VS:  There were no vitals taken for this visit. , BMI There is no height or weight on file to calculate BMI. GEN: Well nourished, well developed, in no acute distress. HEENT: normal. Neck: Supple, no JVD, carotid bruits, or masses. Cardiac: RRR, no murmurs, rubs, or gallops. No clubbing, cyanosis, edema.  Radials/DP/PT 2+ and equal bilaterally.  Respiratory:  Respirations regular and unlabored, clear to auscultation bilaterally. GI: Soft, nontender, nondistended, BS + x 4. MS: no deformity or atrophy. Skin: warm and dry, no rash. Neuro:  Strength and sensation are intact. Psych: Normal affect.  Accessory Clinical Findings    Recent Labs: No results found for requested labs within last 365 days.   Recent Lipid Panel No results found for: "CHOL", "TRIG", "HDL", "CHOLHDL", "VLDL", "LDLCALC", "LDLDIRECT"  No BP recorded.  {Refresh Note OR Click here to enter BP  :1}***    ECG personally reviewed by me today- ***    CT angio chest 07/26/2022       FINDINGS: Cardiovascular: Satisfactory  opacification of the pulmonary arteries to the segmental level. No evidence of pulmonary embolism. Unchanged 4.1 cm ascending aortic aneurysm (image 55 series 8). Aberrant right subclavian artery.   Mediastinum/Nodes: No enlarged mediastinal, hilar, or axillary lymph nodes. Thyroid gland, trachea, and esophagus demonstrate no significant findings.   Lungs/Pleura: Lungs are clear. No pleural effusion or pneumothorax.   Upper Abdomen: No acute abnormality.   Musculoskeletal: Mild sclerosis of the lateral left fifth and right seventh ribs, favored to represent healed rib fractures. No acute bony abnormality.   Review of the MIP images confirms the above findings.   IMPRESSION: 1. No evidence of pulmonary embolism or other acute findings in the chest. 2. Unchanged 4.1 cm ascending aortic aneurysm.     Electronically Signed   By: Audra Blend M.D.   On: 07/26/2022 11:58   Coronary CTA 08/17/2022   FINDINGS: Vascular: Similar aneurysmal dilation of the ascending thoracic aorta measuring 4.1 cm.   Mediastinum/Nodes: No pathologically enlarged  mediastinal, or hilar lymph nodes in the visualized portions of the chest. The distal esophagus is grossly unremarkable.   Lungs/Pleura: Within the visualized portions of the thorax there are no suspicious appearing pulmonary nodules or masses, there is no acute consolidative airspace disease, no pleural effusions and no pneumothorax   Upper Abdomen: Visualized portions of the upper abdomen are unremarkable.   Musculoskeletal: No acute osseous abnormality.   IMPRESSION: Similar aneurysmal dilation of the ascending thoracic aorta measuring 4.1 cm in diameter.     Electronically Signed   By: Tama Fails M.D.   On: 08/18/2022 08:10    Addended by Campbell Centers, MD on 08/18/2022  8:12 AM  ADDENDUM REPORT: 08/17/2022 16:59   IMPRESSION: 1. There is minimal (<25%) soft plaque in the proximal LAD. Coronary calcium score of  0. This was 1st percentile for age-, race-, and sex-matched controls.   2. Normal coronary origin with right dominance.   3. Ascending aorta mildly dilated.  3.9 cm.   Tiffany C. Theodis Fiscal, MD     Electronically Signed   By: Maudine Sos M.D.   On: 08/17/2022 16:59        Assessment & Plan   1.  ***   Chet Cota. Michaelene Dutan NP-C     10/18/2023, 12:35 PM [facility] [address] Office [phone] Fax [phone]    I spent***minutes examining this patient, reviewing medications, and using patient centered shared decision making involving their cardiac care.   I spent  20 minutes reviewing past medical history,  medications, and prior cardiac tests.

## 2023-10-20 ENCOUNTER — Ambulatory Visit: Payer: Self-pay | Admitting: General Practice

## 2023-11-02 DIAGNOSIS — I7121 Aneurysm of the ascending aorta, without rupture: Secondary | ICD-10-CM | POA: Diagnosis not present

## 2023-11-02 DIAGNOSIS — Z6831 Body mass index (BMI) 31.0-31.9, adult: Secondary | ICD-10-CM | POA: Diagnosis not present

## 2023-11-02 DIAGNOSIS — G8929 Other chronic pain: Secondary | ICD-10-CM | POA: Diagnosis not present

## 2023-11-02 DIAGNOSIS — M545 Low back pain, unspecified: Secondary | ICD-10-CM | POA: Diagnosis not present

## 2023-11-17 DIAGNOSIS — I2510 Atherosclerotic heart disease of native coronary artery without angina pectoris: Secondary | ICD-10-CM | POA: Diagnosis not present

## 2023-11-17 DIAGNOSIS — I7121 Aneurysm of the ascending aorta, without rupture: Secondary | ICD-10-CM | POA: Diagnosis not present

## 2023-11-28 DIAGNOSIS — N3001 Acute cystitis with hematuria: Secondary | ICD-10-CM | POA: Diagnosis not present

## 2024-02-19 DIAGNOSIS — Z30433 Encounter for removal and reinsertion of intrauterine contraceptive device: Secondary | ICD-10-CM | POA: Diagnosis not present

## 2024-03-19 DIAGNOSIS — H409 Unspecified glaucoma: Secondary | ICD-10-CM | POA: Diagnosis not present

## 2024-03-19 DIAGNOSIS — I7121 Aneurysm of the ascending aorta, without rupture: Secondary | ICD-10-CM | POA: Diagnosis not present

## 2024-03-19 DIAGNOSIS — R0600 Dyspnea, unspecified: Secondary | ICD-10-CM | POA: Diagnosis not present

## 2024-03-19 DIAGNOSIS — Z6830 Body mass index (BMI) 30.0-30.9, adult: Secondary | ICD-10-CM | POA: Diagnosis not present

## 2024-04-02 DIAGNOSIS — Z6830 Body mass index (BMI) 30.0-30.9, adult: Secondary | ICD-10-CM | POA: Diagnosis not present

## 2024-04-02 DIAGNOSIS — I10 Essential (primary) hypertension: Secondary | ICD-10-CM | POA: Diagnosis not present
